# Patient Record
Sex: Female | Born: 1940 | Race: White | Hispanic: No | Marital: Married | Smoking: Never smoker
Health system: Southern US, Community
[De-identification: ages and names within clinical notes are randomized; demographics above are authoritative.]

## PROBLEM LIST (undated history)

## (undated) DIAGNOSIS — M1990 Unspecified osteoarthritis, unspecified site: Secondary | ICD-10-CM

## (undated) DIAGNOSIS — E042 Nontoxic multinodular goiter: Secondary | ICD-10-CM

## (undated) DIAGNOSIS — M4802 Spinal stenosis, cervical region: Secondary | ICD-10-CM

## (undated) DIAGNOSIS — K219 Gastro-esophageal reflux disease without esophagitis: Secondary | ICD-10-CM

## (undated) DIAGNOSIS — M48061 Spinal stenosis, lumbar region without neurogenic claudication: Secondary | ICD-10-CM

## (undated) DIAGNOSIS — T8859XA Other complications of anesthesia, initial encounter: Secondary | ICD-10-CM

## (undated) DIAGNOSIS — T4145XA Adverse effect of unspecified anesthetic, initial encounter: Secondary | ICD-10-CM

## (undated) DIAGNOSIS — E559 Vitamin D deficiency, unspecified: Secondary | ICD-10-CM

## (undated) DIAGNOSIS — N183 Chronic kidney disease, stage 3 unspecified: Secondary | ICD-10-CM

## (undated) DIAGNOSIS — Z789 Other specified health status: Secondary | ICD-10-CM

## (undated) DIAGNOSIS — D329 Benign neoplasm of meninges, unspecified: Secondary | ICD-10-CM

## (undated) DIAGNOSIS — E7800 Pure hypercholesterolemia, unspecified: Secondary | ICD-10-CM

## (undated) DIAGNOSIS — N3281 Overactive bladder: Secondary | ICD-10-CM

## (undated) DIAGNOSIS — I10 Essential (primary) hypertension: Secondary | ICD-10-CM

## (undated) DIAGNOSIS — G4700 Insomnia, unspecified: Secondary | ICD-10-CM

## (undated) DIAGNOSIS — R0602 Shortness of breath: Secondary | ICD-10-CM

## (undated) DIAGNOSIS — K589 Irritable bowel syndrome without diarrhea: Secondary | ICD-10-CM

## (undated) HISTORY — DX: Spinal stenosis, lumbar region without neurogenic claudication: M48.061

## (undated) HISTORY — DX: Other specified health status: Z78.9

## (undated) HISTORY — PX: CARDIAC CATHETERIZATION: SHX172

## (undated) HISTORY — DX: Benign neoplasm of meninges, unspecified: D32.9

## (undated) HISTORY — DX: Nontoxic multinodular goiter: E04.2

## (undated) HISTORY — PX: ABDOMINAL HYSTERECTOMY: SHX81

## (undated) HISTORY — DX: Insomnia, unspecified: G47.00

## (undated) HISTORY — DX: Pure hypercholesterolemia, unspecified: E78.00

## (undated) HISTORY — DX: Spinal stenosis, cervical region: M48.02

## (undated) HISTORY — PX: LUMBAR FUSION: SHX111

## (undated) HISTORY — DX: Overactive bladder: N32.81

## (undated) HISTORY — DX: Chronic kidney disease, stage 3 unspecified: N18.30

## (undated) HISTORY — PX: CERVICAL DISC SURGERY: SHX588

## (undated) HISTORY — DX: Irritable bowel syndrome, unspecified: K58.9

## (undated) HISTORY — PX: CHOLECYSTECTOMY: SHX55

## (undated) HISTORY — DX: Vitamin D deficiency, unspecified: E55.9

## (undated) HISTORY — PX: BACK SURGERY: SHX140

---

## 2008-07-19 ENCOUNTER — Inpatient Hospital Stay (HOSPITAL_COMMUNITY): Admission: RE | Admit: 2008-07-19 | Discharge: 2008-07-22 | Payer: Self-pay | Admitting: Neurosurgery

## 2008-12-10 ENCOUNTER — Encounter: Admission: RE | Admit: 2008-12-10 | Discharge: 2008-12-10 | Payer: Self-pay | Admitting: Neurosurgery

## 2008-12-15 ENCOUNTER — Inpatient Hospital Stay (HOSPITAL_COMMUNITY): Admission: RE | Admit: 2008-12-15 | Discharge: 2008-12-16 | Payer: Self-pay | Admitting: Neurosurgery

## 2009-06-18 ENCOUNTER — Encounter: Admission: RE | Admit: 2009-06-18 | Discharge: 2009-06-18 | Payer: Self-pay | Admitting: Neurosurgery

## 2010-10-22 LAB — DIFFERENTIAL
Eosinophils Relative: 0 % (ref 0–5)
Lymphocytes Relative: 19 % (ref 12–46)
Lymphs Abs: 1.8 10*3/uL (ref 0.7–4.0)
Monocytes Relative: 4 % (ref 3–12)

## 2010-10-22 LAB — PROTIME-INR
INR: 1 (ref 0.00–1.49)
Prothrombin Time: 13.4 seconds (ref 11.6–15.2)

## 2010-10-22 LAB — CBC
HCT: 36.7 % (ref 36.0–46.0)
Hemoglobin: 12.7 g/dL (ref 12.0–15.0)
Platelets: 184 10*3/uL (ref 150–400)
RBC: 3.88 MIL/uL (ref 3.87–5.11)
WBC: 9.8 10*3/uL (ref 4.0–10.5)

## 2010-10-22 LAB — BASIC METABOLIC PANEL
BUN: 15 mg/dL (ref 6–23)
GFR calc Af Amer: >60 mL/min (ref >60)
GFR calc non Af Amer: >60 mL/min (ref >60)
Potassium: 3.7 mEq/L (ref 3.5–5.1)
Sodium: 139 mEq/L (ref 135–145)

## 2010-10-29 LAB — BASIC METABOLIC PANEL
CO2: 32 mEq/L (ref 19–32)
Calcium: 7.4 mg/dL — ABNORMAL LOW (ref 8.4–10.5)
GFR calc Af Amer: >60 mL/min (ref >60)
GFR calc non Af Amer: >60 mL/min (ref >60)
Sodium: 133 mEq/L — ABNORMAL LOW (ref 135–145)

## 2010-10-29 LAB — CBC
Hemoglobin: 10 g/dL — ABNORMAL LOW (ref 12.0–15.0)
RBC: 3.11 MIL/uL — ABNORMAL LOW (ref 3.87–5.11)

## 2010-11-27 NOTE — Op Note (Signed)
[name], [name]                ACCOUNT NO.:  [id number]   MEDICAL RECORD NO.:  [id number]          PATIENT TYPE:  INP   LOCATION:  3018                         FACILITY:  [facility]   PHYSICIAN:  Clydene Fake, M.D.  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   PROCEDURE:  Decompressive laminectomy, decompressing L3-L4, and L5 root  (3 levels), posterior right fusion at L3-4, L4-L5, Saber interbody cages  at L3-4 and L4-5, Expedium segmented pedicle screw fixation L3 through  L5, posterolateral fusion L3 through L5 (2 levels) autograft same  incision infuse BMP.   SURGEON:  Clydene Fake, MD   ASSISTANT:  Danae Orleans. Venetia Maxon, MD.   ANESTHESIA:  General endotracheal tube anesthesia.   BLOOD LOSS:  700 mL.   BLOOD GIVEN:  400 mL.   CELL SAVER:  Returned.   COMPLICATIONS:  None.   DRAINS:  None.   INDICATIONS:  The patient is a 70 year old woman with back and leg pain,  trouble walking.  MRI and x-rays were done showing anterolisthesis at L4-  L5 almost grade 2 severe spondylitic changes at about L3-4, L4-5, severe  stenosis to the spinal change.  Note, the numbering system is based on  the MRI and the MRI report.  X-rays were done it does appear that there  is a lumbar loss at S1and S2 and these levels most likely L4-5 for the  rest this dictation and the name of operation were taken with the  numbering system from the MRI.  Following this L3-4 and L4-5 level of  the sacralized L5-S1   PROCEDURE IN DETAIL:  The patient was brought to operating room and  general anesthesia induced.  The patient was placed on prone position in  a Wilson frame, all pressure points were  padded.  The patient was  prepped and draped in a sterile fashion.  Standard incision injected  with 20 mL of 1% Xylocaine with epinephrine.  Incision was made midline  in the lower lumbar spine incision taken down the fascia.  Hemostasis  was obtained with Bovie cauterization.  Fascia was incised and  subperiosteal dissection was done with the spinous process lamina and  couple of this segments and markers were placed interspace.  X-rays  obtained showing markers were at L4-5 and L5-1 levels.  We then did our  full exposure exposing the spinous process lamina, facets and transverse  processes at L4-5 and S1.  Markers were then placed at the transverse  processes L3-L4 and L5.  Another x-rays was obtained confirming our  positioning at this point.  Decompressive laminectomy was then done  using Leksell rongeur, Kerrison punches, high-speed drill and  decompressing the thecal sac and decompressing the nerve roots of L3-L4  and L5 bilaterally.  An extensive lateral decompression was done over  all these nerve roots above  and beyond was needed for interbody fusion  to make sure we had good decompression of the nerve roots performed  significant medial facetectomy at the levels.  Once we had good  decompression of the thecal sac nerve roots and explored disk spaces we  can see the spinal pieces at L4-5.  Disk space was massaged and  diskectomy was made pituitary rongeurs and curettes.  We distracted the  interspace of 10 mm bilaterally prepared the interspace for interbody  fusion using the scrapers, broaches, and curettes.  All bone that was  removed during laminectomy was cleaned from its soft tissue and chopped  into small pieces.  Two tail Saber cages were then packed with infuse  BMP and autograft bone was packed.  The interspace with autograft bone  tapped the cages into place and the interspace at L4-5.  This process  repeated at L3-4.  Diskectomy was performed distracting the  interspace  with 10 mm prepared the space for interbody fusion using the broaches  and then packed two 10 mm high Saber cages with infused BMP autograft  bone and tapped the cages into place after packing interspace with  autograft bone.  Explored the thecal sac and nerve roots.  We had good  decompression still and the cages were firmly in place.  We used high-  speed drill to decorticate lateral facets and transverse process from L3-  5 bilaterally using fluoroscopy and intraoperative landmarks, pedicle  entry points L3-L4 and L5 were found, decorticated with high-speed  drill.  The probe was placed on the pedicle.  We tapped the pedicle into  small ball probe to make sure good bony circumference then placed an  Expedium pedicle screw.  This was done first on the right side and then  the left side.  We took final AP and lateral fluoroscopic images showing  good position at the interbody cages and pedicle screws.  We packed the  posterior gutters with infused BMP bilaterally.  The rest of the  autograft bone from L3 through L5 and L3 through L5 fusion.  Rods were  placed in the screw heads on both sides locking nuts placed.  These were  final tightened.  We explored the second nerve roots.  We achieved good  hemostasis.  L3-4, L4-5 nerve roots and central canal well decompressed  and placed some Gelfoam over the lateral gutters, no bone fragments that  could impinge on the nerve root.  Retractors removed and paraspinous  muscles closed with 0 Vicryl interrupted sutures.  Fascia closed with 0  Vicryl interrupted suture.  Skin closed with 2-0 and 3-0 Vicryl  interrupted sutures.  Skin closed with benzoin, Steri-Strips.  Dressing  was placed.  The patient was placed back in a supine position.  The  patient was transferred to the recovery in stable condition.           ______________________________  Clydene Fake, M.D.     JRH/MEDQ  D:  07/19/2008  T:  07/20/2008  Job:  [id number]

## 2010-11-27 NOTE — Op Note (Signed)
NAMECAMBRY, SPAMPINATO                ACCOUNT NO.:  [id number]   MEDICAL RECORD NO.:  [id number]          PATIENT TYPE:  INP   LOCATION:  3536                         FACILITY:  [facility]   PHYSICIAN:  Clydene Fake, M.D.  DATE OF BIRTH:  02/08/1941   DATE OF PROCEDURE:  12/15/2008  DATE OF DISCHARGE:                               OPERATIVE REPORT   PREOPERATIVE DIAGNOSES:  Herniated nucleus pulposus, stenosis,  myelopathy, and cord compression of C4-5 and C5-6.   POSTOPERATIVE DIAGNOSES:  Herniated nucleus pulposus, stenosis,  myelopathy, and cord compression of C4-5 and C5-6.   PROCEDURE:  Anterior cervical decompression, diskectomy, and fusion at  C4-5 and C5-6 with LifeNet allograft bone and Trestle anterior cervical  plate.   SURGEON:  Clydene Fake, MD   ASSISTANT:  Payton Doughty, MD.   General endotracheal tube anesthesia.   ESTIMATED BLOOD LOSS:  Minimal.   BLOOD GIVEN:  None.   DRAINS:  None.   COMPLICATIONS:  None.   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  Reflex  shows __________ 3+/4 in the upper and lower extremities with couple  beats of clonus bilaterally, and gait is wide based and certainly in the  stable.  MRI of cervical spine shows disk herniation centrally at C4-5  causing cord compression, and there is a cord change and cord edema at  that level, and there is severe spinal changes at C5-6 causing stenosis  also.  The patient brought in for decompression and fusion.   PROCEDURE IN DETAIL:  The patient was brought to the operating room,  general anesthesia was induced.  The patient was placed in 10-pound  Halter traction, prepped and draped in the sterile fashion.  Site of  incision injected with 10 mL of 1% lidocaine with epinephrine.  Incision  was then made from the midline to the anterior  border of the  sternocleidomastoid muscle on the left side.  The neck incision was  taken down to the platysma and hemostasis was obtained with Bovie  cauterization.  The platysma was incised with a Bovie and blunt  dissection taken through the anterior cervical fascia at the anterior  cervical spine.  Needle was placed in interspace.  X-rays were obtained  showing the needles at the C4-5 interspace.  Disk space was incised with  15 blade and partial diskectomy performed as the needle was removed.  The longus colli muscles were reflected laterally from C4 through C6,  and a self-retaining retractor was placed.  Osteophyte tool was used to  remove osteophytes at C4-5 and C5-6.  Disk spaces were incised and  diskectomy continued at C4-5 and C5-6.  Anterior osteophytes were  removed with high-speed drill and Kerrison punches, and distraction pins  were placed in the C4 and C6 and interspaces distracted.  Microscope was  brought in for microdissection at this point and __________ C4-5 level  where diskectomy continued with the curettes  and pituitary rongeurs and  a large central disk herniation was found compressing into the spinal  cord and this was removed with micro hooks and pituitary rongeurs.  We  then continued to remove the posterior disk, osteophyte, and ligament  with Leksell rongeurs, good decompression of the central canal and  performed bilateral foraminotomies.  When we were finished we had good  decompression, spinal cord and bilateral nerve roots, used high-speed  drill to remove cartilaginous endplates, measured the disk space to be 6  mm.  We got hemostasis with Gelfoam and thrombin.  The C5-6 level was  clearly spondylitic, and we used to drill to drill through the disk  space and cartilaginous endplate down to the posterior ligament, and 1-  and 2-mm Kerrison punches then used to remove posterior disk ligaments  and osteophytes, decompression of the central canal and  performed  bilateral foraminotomies where there appears to be good central lateral  decompression.  We measured height of disk space to be at 4 mm, and we  got hemostasis with Gelfoam and thrombin.  Gelfoam and thrombin was  irrigated out from the 2 disk spaces and we tapped 6-mm bone graft in  place and then the 4-mm bone graft in place.  We checked posterior to  the graft, there was plenty of room between bone graft and dura at each  spots.  Distraction pins were removed.  Hemostasis obtained with Gelfoam  and thrombin.  Weight was removed from the traction.  Bone was firmly in  place, and a Trestle anterior cervical plate was placed over the  anterior cervical spine, and 2 screws were placed in the C4, 2 in the  C5, 2 in the C6.  These were all tightened down.  Lateral x-rays were  obtained showing good position of  plates, screws, and bone plug at C4-5  and C5-6.  Retractors were removed.  Hemostasis was obtained with  Gelfoam and thrombin and we irrigated that out with antibiotic solution.  We had good hemostasis and the platysma was closed with 3-0 Vicryl  interrupted sutures.  Subcutaneous tissue closed with same.  Skin closed  with Benzoin and Steri-Strips.  Dressing was placed.  The patient was  placed in a soft cervical collar, awoken from anesthesia, and  transferred to the recovery room in stable condition.           ______________________________  Clydene Fake, M.D.     JRH/MEDQ  D:  12/15/2008  T:  12/16/2008  Job:  [id number]

## 2010-11-30 NOTE — Discharge Summary (Signed)
NAMECARMEN, [name]                ACCOUNT NO.:  [id number]   MEDICAL RECORD NO.:  [id number]          PATIENT TYPE:  INP   LOCATION:  3018                         FACILITY:  [facility]   PHYSICIAN:  Clydene Fake, M.D.  DATE OF BIRTH:  [birth year]   DATE OF ADMISSION:  07/19/2008  DATE OF DISCHARGE:  07/22/2008                               DISCHARGE SUMMARY   ADMISSION DIAGNOSES:  Unstable spondylolisthesis, stenosis, spondylosis  with radiculopathy, L3-L4 and L4-L5.   DISCHARGE DIAGNOSES:  Unstable spondylolisthesis, stenosis, spondylosis  with radiculopathy, L3-L4 and L4-L5.   PROCEDURES:  Decompressive laminectomy, decompression of the L3, L4, L5  roots, posterior lumbar interbody fusion, L3-L4 and L4-L5 with Saber  interbody cages, Expedium pedicle screw fixation, and posterolateral  fusion with autograft and Infuse.   REASON FOR ADMISSION:  The patient is a 70 year old woman with back and  right leg pain and trouble walking.  MRI and x-rays show retrolisthesis  of L4-L5 and severe stenosis, spinal changes, degenerative disk disease,  disk herniation at L3-L4 and L4-L5 causing root compression and  stenosis.  The patient was admitted for decompression and fusion.   HOSPITAL COURSE:  The patient was admitted on the day of surgery and  underwent the procedure above without complications.  Postop, the  patient was transferred to the recovery room and then to the floor.  The  following day, the incision was clean, dry, and intact with much less  leg pain, similar incision pain.  She was started on PCA and then  started increasing activity and getting up PT, OT consultations to  assist with the patient's mobility.  The following day, she was up and  ambulating, up in chair again with no leg pain or ambulating better.  I  am happy with her progress and we just switched her to p.o. medicines  and advanced diet.  By July 22, 2008, she had bowel movements, she was  up and ambulating  well, less incisional pain, and was discharged to home  in stable condition.   DISCHARGE MEDICATIONS:  Same as prehospitalization plus Percocet,  Flexeril, and 5 days of Cipro.   FOLLOWUP:  Followup with me in 3 weeks in my office up with brace.   ACTIVITY:  No strenuous activity.           ______________________________  Clydene Fake, M.D.     JRH/MEDQ  D:  08/18/2008  T:  08/18/2008  Job:  [id number]

## 2011-04-19 LAB — BASIC METABOLIC PANEL
CO2: 29 mEq/L (ref 19–32)
Glucose, Bld: 87 mg/dL (ref 70–99)
Potassium: 3.3 mEq/L — ABNORMAL LOW (ref 3.5–5.1)
Sodium: 134 mEq/L — ABNORMAL LOW (ref 135–145)

## 2011-04-19 LAB — TYPE AND SCREEN: Antibody Screen: NEGATIVE

## 2011-04-19 LAB — URINALYSIS, ROUTINE W REFLEX MICROSCOPIC
Bilirubin Urine: NEGATIVE
Hgb urine dipstick: NEGATIVE
Nitrite: NEGATIVE
Specific Gravity, Urine: 1.004 — ABNORMAL LOW (ref 1.005–1.030)
pH: 6 (ref 5.0–8.0)

## 2011-04-19 LAB — CBC
HCT: 40.3 % (ref 36.0–46.0)
Hemoglobin: 13.6 g/dL (ref 12.0–15.0)
MCHC: 33.8 g/dL (ref 30.0–36.0)
RDW: 12.9 % (ref 11.5–15.5)

## 2011-10-28 ENCOUNTER — Other Ambulatory Visit: Payer: Self-pay | Admitting: Neurosurgery

## 2011-10-28 DIAGNOSIS — M4712 Other spondylosis with myelopathy, cervical region: Secondary | ICD-10-CM

## 2011-10-28 DIAGNOSIS — M545 Low back pain: Secondary | ICD-10-CM

## 2011-11-04 ENCOUNTER — Ambulatory Visit
Admission: RE | Admit: 2011-11-04 | Discharge: 2011-11-04 | Disposition: A | Discharge status: Home / Self Care | Payer: BC Managed Care – PPO | Source: Ambulatory Visit | Attending: Neurosurgery | Admitting: Neurosurgery

## 2011-11-04 DIAGNOSIS — M545 Low back pain: Secondary | ICD-10-CM

## 2011-11-04 DIAGNOSIS — M4712 Other spondylosis with myelopathy, cervical region: Secondary | ICD-10-CM

## 2011-11-04 MED ORDER — GADOBENATE DIMEGLUMINE 529 MG/ML IV SOLN
14.0000 mL | Freq: Once | INTRAVENOUS | Status: AC | PRN
  Administered 2011-11-04: 14 mL via INTRAVENOUS

## 2011-11-12 ENCOUNTER — Other Ambulatory Visit: Payer: Self-pay | Admitting: Neurosurgery

## 2011-12-02 ENCOUNTER — Encounter (HOSPITAL_COMMUNITY): Payer: Self-pay | Admitting: Pharmacy Technician

## 2011-12-03 ENCOUNTER — Inpatient Hospital Stay (HOSPITAL_COMMUNITY): Admission: RE | Admit: 2011-12-03 | Payer: BC Managed Care – PPO | Source: Ambulatory Visit

## 2011-12-04 ENCOUNTER — Inpatient Hospital Stay (HOSPITAL_COMMUNITY): Admission: RE | Admit: 2011-12-04 | Discharge: 2011-12-04 | Payer: Medicare Other | Source: Ambulatory Visit

## 2011-12-04 ENCOUNTER — Encounter (HOSPITAL_COMMUNITY): Payer: Self-pay

## 2011-12-04 HISTORY — DX: Gastro-esophageal reflux disease without esophagitis: K21.9

## 2011-12-04 HISTORY — DX: Unspecified osteoarthritis, unspecified site: M19.90

## 2011-12-04 HISTORY — DX: Essential (primary) hypertension: I10

## 2011-12-04 HISTORY — DX: Shortness of breath: R06.02

## 2011-12-04 NOTE — Progress Notes (Signed)
REQUESTED STRESS TEST FROM [facility]. 

## 2011-12-04 NOTE — Progress Notes (Signed)
VERIFIED LAB APPOINTMENT WITH PATIENT 12/05/11  1000AM.

## 2011-12-04 NOTE — Pre-Procedure Instructions (Signed)
20 [name]  12/04/2011   Your procedure is scheduled on: Monday  12/16/11  Report to [facility] at 530 AM.  Call this number if you have problems the morning of surgery: [phone]   Remember:   Do not eat food:After Midnight.  May have clear liquids: up to 4 Hours before arrival.(UNTIL 130 AM)  Clear liquids include soda, tea, black coffee, apple or grape juice, broth.  Take these medicines the morning of surgery with A SIP OF WATER: ALBUTEROL, TOPROL(METOPROLOL), PRILOSEC   (STOP ASPIRIN, PLAVIX, COUMADIN ,EFFIENT, HERBAL MEDICINES)   Do not wear jewelry, make-up or nail polish.  Do not wear lotions, powders, or perfumes. You may wear deodorant.  Do not shave 48 hours prior to surgery. Men may shave face and neck.  Do not bring valuables to the hospital.  Contacts, dentures or bridgework may not be worn into surgery.  Leave suitcase in the car. After surgery it may be brought to your room.  For patients admitted to the hospital, checkout time is 11:00 AM the day of discharge.   Patients discharged the day of surgery will not be allowed to drive home.  Name and phone number of your driver:   Special Instructions: CHG Shower Use Special Wash: 1/2 bottle night before surgery and 1/2 bottle morning of surgery.   Please read over the following fact sheets that you were given: Pain Booklet, MRSA Information and Surgical Site Infection Prevention

## 2011-12-05 ENCOUNTER — Encounter (HOSPITAL_COMMUNITY)
Admission: RE | Admit: 2011-12-05 | Discharge: 2011-12-05 | Disposition: A | Discharge status: Home / Self Care | Payer: Medicare Other | Source: Ambulatory Visit | Attending: Neurosurgery | Admitting: Neurosurgery

## 2011-12-05 LAB — CBC
MCHC: 35.7 g/dL (ref 30.0–36.0)
Platelets: 223 10*3/uL (ref 150–400)
RDW: 12.7 % (ref 11.5–15.5)

## 2011-12-05 LAB — BASIC METABOLIC PANEL
BUN: 14 mg/dL (ref 6–23)
Calcium: 9.7 mg/dL (ref 8.4–10.5)
Creatinine, Ser: 0.68 mg/dL (ref 0.50–1.10)
GFR calc Af Amer: >90 mL/min (ref >90)
GFR calc non Af Amer: 87 mL/min — ABNORMAL LOW (ref >90)
Potassium: 3.5 mEq/L (ref 3.5–5.1)

## 2011-12-05 LAB — TYPE AND SCREEN
ABO/RH(D): B NEG
Antibody Screen: NEGATIVE

## 2011-12-12 ENCOUNTER — Encounter (HOSPITAL_COMMUNITY): Payer: Self-pay | Admitting: *Deleted

## 2011-12-12 ENCOUNTER — Encounter (HOSPITAL_COMMUNITY): Payer: Self-pay | Admitting: General Practice

## 2011-12-12 ENCOUNTER — Encounter (HOSPITAL_COMMUNITY): Payer: Self-pay | Admitting: Anesthesiology

## 2011-12-12 ENCOUNTER — Ambulatory Visit (HOSPITAL_COMMUNITY): Payer: Medicare Other | Admitting: Anesthesiology

## 2011-12-12 ENCOUNTER — Inpatient Hospital Stay (HOSPITAL_COMMUNITY)
Admission: RE | Admit: 2011-12-12 | Discharge: 2011-12-17 | DRG: 460 | Disposition: A | Discharge status: Home / Self Care | Payer: Medicare Other | Source: Ambulatory Visit | Attending: Neurosurgery | Admitting: Neurosurgery

## 2011-12-12 ENCOUNTER — Ambulatory Visit (HOSPITAL_COMMUNITY): Payer: Medicare Other

## 2011-12-12 ENCOUNTER — Encounter (HOSPITAL_COMMUNITY)
Admission: RE | Disposition: A | Discharge status: Home / Self Care | Payer: Self-pay | Source: Ambulatory Visit | Attending: Neurosurgery

## 2011-12-12 DIAGNOSIS — Y9210 Unspecified residential institution as the place of occurrence of the external cause: Secondary | ICD-10-CM | POA: Diagnosis not present

## 2011-12-12 DIAGNOSIS — G9741 Accidental puncture or laceration of dura during a procedure: Secondary | ICD-10-CM | POA: Diagnosis not present

## 2011-12-12 DIAGNOSIS — K219 Gastro-esophageal reflux disease without esophagitis: Secondary | ICD-10-CM | POA: Diagnosis present

## 2011-12-12 DIAGNOSIS — M4716 Other spondylosis with myelopathy, lumbar region: Principal | ICD-10-CM | POA: Diagnosis present

## 2011-12-12 DIAGNOSIS — J45909 Unspecified asthma, uncomplicated: Secondary | ICD-10-CM | POA: Diagnosis present

## 2011-12-12 DIAGNOSIS — IMO0002 Reserved for concepts with insufficient information to code with codable children: Secondary | ICD-10-CM | POA: Diagnosis not present

## 2011-12-12 DIAGNOSIS — M4310 Spondylolisthesis, site unspecified: Secondary | ICD-10-CM | POA: Diagnosis present

## 2011-12-12 DIAGNOSIS — M129 Arthropathy, unspecified: Secondary | ICD-10-CM | POA: Diagnosis present

## 2011-12-12 DIAGNOSIS — I10 Essential (primary) hypertension: Secondary | ICD-10-CM | POA: Diagnosis present

## 2011-12-12 DIAGNOSIS — Z01812 Encounter for preprocedural laboratory examination: Secondary | ICD-10-CM

## 2011-12-12 HISTORY — DX: Adverse effect of unspecified anesthetic, initial encounter: T41.45XA

## 2011-12-12 HISTORY — DX: Other complications of anesthesia, initial encounter: T88.59XA

## 2011-12-12 LAB — URINALYSIS, ROUTINE W REFLEX MICROSCOPIC
Glucose, UA: NEGATIVE mg/dL
Protein, ur: NEGATIVE mg/dL
pH: 5.5 (ref 5.0–8.0)

## 2011-12-12 LAB — URINE MICROSCOPIC-ADD ON

## 2011-12-12 LAB — DIFFERENTIAL
Lymphocytes Relative: 56 % — ABNORMAL HIGH (ref 12–46)
Lymphs Abs: 3.4 10*3/uL (ref 0.7–4.0)
Monocytes Absolute: 0.4 10*3/uL (ref 0.1–1.0)
Monocytes Relative: 6 % (ref 3–12)
Neutro Abs: 2.2 10*3/uL (ref 1.7–7.7)

## 2011-12-12 SURGERY — POSTERIOR LUMBAR FUSION 1 LEVEL
Anesthesia: General | Site: Back | Wound class: Clean

## 2011-12-12 MED ORDER — DIPHENHYDRAMINE HCL 12.5 MG/5ML PO ELIX: 12.5000 mg | ORAL_SOLUTION | Freq: Four times a day (QID) | ORAL | Status: DC | PRN

## 2011-12-12 MED ORDER — MORPHINE SULFATE (PF) 1 MG/ML IV SOLN
INTRAVENOUS | Status: AC
  Filled 2011-12-12: qty 25

## 2011-12-12 MED ORDER — NEOSTIGMINE METHYLSULFATE 1 MG/ML IJ SOLN
INTRAMUSCULAR | Status: DC | PRN
  Administered 2011-12-12: 4 mg via INTRAVENOUS

## 2011-12-12 MED ORDER — METHOCARBAMOL 100 MG/ML IJ SOLN
500.0000 mg | Freq: Four times a day (QID) | INTRAVENOUS | Status: DC | PRN
  Administered 2011-12-12: 500 mg via INTRAVENOUS
  Filled 2011-12-12: qty 5

## 2011-12-12 MED ORDER — DICYCLOMINE HCL 10 MG PO CAPS
10.0000 mg | ORAL_CAPSULE | Freq: Four times a day (QID) | ORAL | Status: DC | PRN
  Filled 2011-12-12: qty 1

## 2011-12-12 MED ORDER — DEXAMETHASONE SODIUM PHOSPHATE 4 MG/ML IJ SOLN: 4.0000 mg | Freq: Once | INTRAMUSCULAR | Status: DC

## 2011-12-12 MED ORDER — HYDROMORPHONE HCL PF 1 MG/ML IJ SOLN
INTRAMUSCULAR | Status: AC
  Filled 2011-12-12: qty 1

## 2011-12-12 MED ORDER — SODIUM CHLORIDE 0.9 % IR SOLN
Status: DC | PRN
  Administered 2011-12-12: 08:00:00

## 2011-12-12 MED ORDER — ALBUTEROL SULFATE HFA 108 (90 BASE) MCG/ACT IN AERS
2.0000 | INHALATION_SPRAY | Freq: Four times a day (QID) | RESPIRATORY_TRACT | Status: DC | PRN
  Filled 2011-12-12: qty 6.7

## 2011-12-12 MED ORDER — PROMETHAZINE HCL 25 MG PO TABS: 12.5000 mg | ORAL_TABLET | ORAL | Status: DC | PRN

## 2011-12-12 MED ORDER — CEFAZOLIN SODIUM 1-5 GM-% IV SOLN
INTRAVENOUS | Status: AC
  Filled 2011-12-12: qty 50

## 2011-12-12 MED ORDER — SODIUM CHLORIDE 0.9 % IV SOLN: 250.0000 mL | INTRAVENOUS | Status: DC

## 2011-12-12 MED ORDER — THROMBIN 20000 UNITS EX KIT
PACK | CUTANEOUS | Status: DC | PRN
  Administered 2011-12-12: 08:00:00 via TOPICAL

## 2011-12-12 MED ORDER — GLYCOPYRROLATE 0.2 MG/ML IJ SOLN
INTRAMUSCULAR | Status: DC | PRN
  Administered 2011-12-12: 0.6 mg via INTRAVENOUS

## 2011-12-12 MED ORDER — MIDAZOLAM HCL 5 MG/5ML IJ SOLN
INTRAMUSCULAR | Status: DC | PRN
  Administered 2011-12-12 (×2): 1 mg via INTRAVENOUS

## 2011-12-12 MED ORDER — METOPROLOL SUCCINATE ER 25 MG PO TB24
25.0000 mg | ORAL_TABLET | Freq: Every day | ORAL | Status: DC
  Administered 2011-12-14 – 2011-12-17 (×3): 25 mg via ORAL
  Filled 2011-12-12 (×5): qty 1

## 2011-12-12 MED ORDER — DOCUSATE SODIUM 100 MG PO CAPS
100.0000 mg | ORAL_CAPSULE | Freq: Two times a day (BID) | ORAL | Status: DC
  Administered 2011-12-12 – 2011-12-17 (×10): 100 mg via ORAL
  Filled 2011-12-12 (×9): qty 1

## 2011-12-12 MED ORDER — PANTOPRAZOLE SODIUM 40 MG PO TBEC
40.0000 mg | DELAYED_RELEASE_TABLET | Freq: Every day | ORAL | Status: DC
  Administered 2011-12-13 – 2011-12-16 (×4): 40 mg via ORAL
  Filled 2011-12-12 (×4): qty 1

## 2011-12-12 MED ORDER — HYDROMORPHONE HCL PF 1 MG/ML IJ SOLN
0.2500 mg | INTRAMUSCULAR | Status: DC | PRN
  Administered 2011-12-12 (×2): 0.5 mg via INTRAVENOUS

## 2011-12-12 MED ORDER — LIDOCAINE HCL (CARDIAC) 20 MG/ML IV SOLN
INTRAVENOUS | Status: DC | PRN
  Administered 2011-12-12: 40 mg via INTRAVENOUS

## 2011-12-12 MED ORDER — NALOXONE HCL 0.4 MG/ML IJ SOLN: 0.4000 mg | INTRAMUSCULAR | Status: DC | PRN

## 2011-12-12 MED ORDER — DEXAMETHASONE SODIUM PHOSPHATE 4 MG/ML IJ SOLN
INTRAMUSCULAR | Status: AC
  Administered 2011-12-12: 4 mg
  Filled 2011-12-12: qty 1

## 2011-12-12 MED ORDER — ONDANSETRON HCL 4 MG/2ML IJ SOLN: 4.0000 mg | Freq: Four times a day (QID) | INTRAMUSCULAR | Status: DC | PRN

## 2011-12-12 MED ORDER — PROMETHAZINE HCL 25 MG/ML IJ SOLN: 6.2500 mg | INTRAMUSCULAR | Status: DC | PRN

## 2011-12-12 MED ORDER — SODIUM CHLORIDE 0.9 % IV SOLN
INTRAVENOUS | Status: AC
  Filled 2011-12-12: qty 500

## 2011-12-12 MED ORDER — DIPHENHYDRAMINE HCL 50 MG/ML IJ SOLN
12.5000 mg | Freq: Four times a day (QID) | INTRAMUSCULAR | Status: DC | PRN
  Administered 2011-12-13: 12.5 mg via INTRAVENOUS
  Filled 2011-12-12: qty 1

## 2011-12-12 MED ORDER — SODIUM CHLORIDE 0.9 % IJ SOLN: 9.0000 mL | INTRAMUSCULAR | Status: DC | PRN

## 2011-12-12 MED ORDER — ACETAMINOPHEN 650 MG RE SUPP: 650.0000 mg | RECTAL | Status: DC | PRN

## 2011-12-12 MED ORDER — CYCLOBENZAPRINE HCL 10 MG PO TABS
10.0000 mg | ORAL_TABLET | Freq: Three times a day (TID) | ORAL | Status: DC | PRN
  Administered 2011-12-13 – 2011-12-16 (×5): 10 mg via ORAL
  Filled 2011-12-12 (×5): qty 1

## 2011-12-12 MED ORDER — KETOROLAC TROMETHAMINE 30 MG/ML IJ SOLN
INTRAMUSCULAR | Status: AC
  Filled 2011-12-12: qty 1

## 2011-12-12 MED ORDER — SODIUM CHLORIDE 0.9 % IV SOLN
INTRAVENOUS | Status: DC | PRN
  Administered 2011-12-12: 12:00:00 via INTRAVENOUS

## 2011-12-12 MED ORDER — KCL IN DEXTROSE-NACL 20-5-0.45 MEQ/L-%-% IV SOLN
INTRAVENOUS | Status: AC
  Administered 2011-12-12: 1000 mL via INTRAVENOUS
  Filled 2011-12-12: qty 1000

## 2011-12-12 MED ORDER — ONDANSETRON HCL 4 MG/2ML IJ SOLN
INTRAMUSCULAR | Status: DC | PRN
  Administered 2011-12-12: 4 mg via INTRAVENOUS

## 2011-12-12 MED ORDER — SODIUM CHLORIDE 0.9 % IJ SOLN: 3.0000 mL | INTRAMUSCULAR | Status: DC | PRN

## 2011-12-12 MED ORDER — ROCURONIUM BROMIDE 100 MG/10ML IV SOLN
INTRAVENOUS | Status: DC | PRN
  Administered 2011-12-12: 10 mg via INTRAVENOUS
  Administered 2011-12-12: 50 mg via INTRAVENOUS
  Administered 2011-12-12 (×4): 10 mg via INTRAVENOUS

## 2011-12-12 MED ORDER — ALBUTEROL SULFATE (5 MG/ML) 0.5% IN NEBU
INHALATION_SOLUTION | RESPIRATORY_TRACT | Status: AC
  Filled 2011-12-12: qty 0.5

## 2011-12-12 MED ORDER — METHOCARBAMOL 500 MG PO TABS
500.0000 mg | ORAL_TABLET | Freq: Four times a day (QID) | ORAL | Status: DC | PRN
  Administered 2011-12-13: 500 mg via ORAL
  Filled 2011-12-12: qty 1

## 2011-12-12 MED ORDER — LIDOCAINE-EPINEPHRINE 1 %-1:100000 IJ SOLN
INTRAMUSCULAR | Status: DC | PRN
  Administered 2011-12-12: 20 mL

## 2011-12-12 MED ORDER — ZOLPIDEM TARTRATE 5 MG PO TABS: 5.0000 mg | ORAL_TABLET | Freq: Every evening | ORAL | Status: DC | PRN

## 2011-12-12 MED ORDER — KCL IN DEXTROSE-NACL 20-5-0.45 MEQ/L-%-% IV SOLN
INTRAVENOUS | Status: DC
  Administered 2011-12-12: 1000 mL via INTRAVENOUS
  Administered 2011-12-13 – 2011-12-14 (×2): via INTRAVENOUS
  Filled 2011-12-12 (×10): qty 1000

## 2011-12-12 MED ORDER — LACTATED RINGERS IV SOLN
INTRAVENOUS | Status: DC | PRN
  Administered 2011-12-12 (×3): via INTRAVENOUS

## 2011-12-12 MED ORDER — BACITRACIN 50000 UNITS IM SOLR
INTRAMUSCULAR | Status: AC
  Filled 2011-12-12: qty 1

## 2011-12-12 MED ORDER — MIDAZOLAM HCL 2 MG/2ML IJ SOLN: 0.5000 mg | Freq: Once | INTRAMUSCULAR | Status: DC | PRN

## 2011-12-12 MED ORDER — FLEET ENEMA 7-19 GM/118ML RE ENEM: 1.0000 | ENEMA | Freq: Once | RECTAL | Status: AC | PRN

## 2011-12-12 MED ORDER — ONDANSETRON HCL 4 MG/2ML IJ SOLN: 4.0000 mg | INTRAMUSCULAR | Status: DC | PRN

## 2011-12-12 MED ORDER — BISACODYL 10 MG RE SUPP: 10.0000 mg | Freq: Every day | RECTAL | Status: DC | PRN

## 2011-12-12 MED ORDER — SODIUM CHLORIDE 0.9 % IJ SOLN
3.0000 mL | Freq: Two times a day (BID) | INTRAMUSCULAR | Status: DC
  Administered 2011-12-12 – 2011-12-16 (×7): 3 mL via INTRAVENOUS

## 2011-12-12 MED ORDER — ASPIRIN EC 325 MG PO TBEC
325.0000 mg | DELAYED_RELEASE_TABLET | Freq: Every day | ORAL | Status: DC
  Administered 2011-12-14 – 2011-12-17 (×4): 325 mg via ORAL
  Filled 2011-12-12 (×7): qty 1

## 2011-12-12 MED ORDER — MORPHINE SULFATE (PF) 1 MG/ML IV SOLN
INTRAVENOUS | Status: DC
  Administered 2011-12-12: 15:00:00 via INTRAVENOUS
  Administered 2011-12-12: 7 mg via INTRAVENOUS
  Administered 2011-12-13: 1 mg via INTRAVENOUS

## 2011-12-12 MED ORDER — HYDROCHLOROTHIAZIDE 25 MG PO TABS
25.0000 mg | ORAL_TABLET | Freq: Every day | ORAL | Status: DC
  Administered 2011-12-14 – 2011-12-17 (×3): 25 mg via ORAL
  Filled 2011-12-12 (×5): qty 1

## 2011-12-12 MED ORDER — KETOROLAC TROMETHAMINE 30 MG/ML IJ SOLN
30.0000 mg | Freq: Four times a day (QID) | INTRAMUSCULAR | Status: AC
  Administered 2011-12-12 – 2011-12-13 (×4): 30 mg via INTRAVENOUS
  Filled 2011-12-12 (×3): qty 1

## 2011-12-12 MED ORDER — CEFAZOLIN SODIUM 1-5 GM-% IV SOLN
1.0000 g | Freq: Once | INTRAVENOUS | Status: AC
  Administered 2011-12-12: 1 g via INTRAVENOUS

## 2011-12-12 MED ORDER — FENTANYL CITRATE 0.05 MG/ML IJ SOLN
INTRAMUSCULAR | Status: DC | PRN
  Administered 2011-12-12: 150 ug via INTRAVENOUS
  Administered 2011-12-12 (×4): 50 ug via INTRAVENOUS

## 2011-12-12 MED ORDER — PROMETHAZINE HCL 25 MG/ML IJ SOLN
12.5000 mg | INTRAMUSCULAR | Status: DC | PRN
  Filled 2011-12-12: qty 1

## 2011-12-12 MED ORDER — ACETAMINOPHEN 325 MG PO TABS: 650.0000 mg | ORAL_TABLET | ORAL | Status: DC | PRN

## 2011-12-12 MED ORDER — DEXAMETHASONE SODIUM PHOSPHATE 4 MG/ML IJ SOLN
INTRAMUSCULAR | Status: DC | PRN
  Administered 2011-12-12: 4 mg via INTRAVENOUS

## 2011-12-12 MED ORDER — ALBUTEROL SULFATE (5 MG/ML) 0.5% IN NEBU
2.5000 mg | INHALATION_SOLUTION | RESPIRATORY_TRACT | Status: AC
  Administered 2011-12-12: 2.5 mg via RESPIRATORY_TRACT

## 2011-12-12 MED ORDER — 0.9 % SODIUM CHLORIDE (POUR BTL) OPTIME
TOPICAL | Status: DC | PRN
  Administered 2011-12-12: 1000 mL

## 2011-12-12 MED ORDER — PROPOFOL 10 MG/ML IV EMUL
INTRAVENOUS | Status: DC | PRN
  Administered 2011-12-12: 100 mg via INTRAVENOUS

## 2011-12-12 MED ORDER — MAGNESIUM HYDROXIDE 400 MG/5ML PO SUSP: 30.0000 mL | Freq: Every day | ORAL | Status: DC | PRN

## 2011-12-12 MED ORDER — CEFAZOLIN SODIUM 1-5 GM-% IV SOLN
1.0000 g | Freq: Three times a day (TID) | INTRAVENOUS | Status: AC
  Administered 2011-12-12 – 2011-12-13 (×3): 1 g via INTRAVENOUS
  Filled 2011-12-12 (×3): qty 50

## 2011-12-12 MED ORDER — MEPERIDINE HCL 25 MG/ML IJ SOLN: 6.2500 mg | INTRAMUSCULAR | Status: DC | PRN

## 2011-12-12 SURGICAL SUPPLY — 72 items
BAG DECANTER FOR FLEXI CONT (MISCELLANEOUS) ×2 IMPLANT
BENZOIN TINCTURE PRP APPL 2/3 (GAUZE/BANDAGES/DRESSINGS) ×2 IMPLANT
BLADE SURG ROTATE 9660 (MISCELLANEOUS) IMPLANT
BUR PRECISION FLUTE 5.0 (BURR) ×2 IMPLANT
CAGE SABER PLIF 9X9X21MM (Cage) ×4 IMPLANT
CANISTER SUCTION 2500CC (MISCELLANEOUS) ×2 IMPLANT
CLOTH BEACON ORANGE TIMEOUT ST (SAFETY) ×2 IMPLANT
CONT SPEC 4OZ CLIKSEAL STRL BL (MISCELLANEOUS) ×4 IMPLANT
COVER BACK TABLE 24X17X13 BIG (DRAPES) IMPLANT
COVER TABLE BACK 60X90 (DRAPES) ×2 IMPLANT
DECANTER SPIKE VIAL GLASS SM (MISCELLANEOUS) ×2 IMPLANT
DERMABOND ADVANCED (GAUZE/BANDAGES/DRESSINGS)
DERMABOND ADVANCED .7 DNX12 (GAUZE/BANDAGES/DRESSINGS) IMPLANT
DRAPE C-ARM 42X72 X-RAY (DRAPES) ×4 IMPLANT
DRAPE LAPAROTOMY 100X72X124 (DRAPES) ×2 IMPLANT
DRAPE MICROSCOPE LEICA (MISCELLANEOUS) ×2 IMPLANT
DRAPE POUCH INSTRU U-SHP 10X18 (DRAPES) ×2 IMPLANT
DRAPE PROXIMA HALF (DRAPES) IMPLANT
DRESSING TELFA 8X3 (GAUZE/BANDAGES/DRESSINGS) ×2 IMPLANT
DRSG OPSITE 4X5.5 SM (GAUZE/BANDAGES/DRESSINGS) ×2 IMPLANT
DURAPREP 26ML APPLICATOR (WOUND CARE) ×2 IMPLANT
DURASEAL SPINE SEALANT 3ML (MISCELLANEOUS) ×2 IMPLANT
ELECT REM PT RETURN 9FT ADLT (ELECTROSURGICAL) ×2
ELECTRODE REM PT RTRN 9FT ADLT (ELECTROSURGICAL) ×1 IMPLANT
GAUZE SPONGE 4X4 16PLY XRAY LF (GAUZE/BANDAGES/DRESSINGS) IMPLANT
GLOVE BIOGEL PI IND STRL 7.0 (GLOVE) ×2 IMPLANT
GLOVE BIOGEL PI IND STRL 8 (GLOVE) ×1 IMPLANT
GLOVE BIOGEL PI INDICATOR 7.0 (GLOVE) ×2
GLOVE BIOGEL PI INDICATOR 8 (GLOVE) ×1
GLOVE ECLIPSE 7.5 STRL STRAW (GLOVE) ×6 IMPLANT
GLOVE EXAM NITRILE LRG STRL (GLOVE) IMPLANT
GLOVE EXAM NITRILE MD LF STRL (GLOVE) ×2 IMPLANT
GLOVE EXAM NITRILE XL STR (GLOVE) IMPLANT
GLOVE EXAM NITRILE XS STR PU (GLOVE) IMPLANT
GLOVE SURG SS PI 6.5 STRL IVOR (GLOVE) ×8 IMPLANT
GOWN BRE IMP SLV AUR LG STRL (GOWN DISPOSABLE) IMPLANT
GOWN BRE IMP SLV AUR XL STRL (GOWN DISPOSABLE) IMPLANT
GOWN STRL REIN 2XL LVL4 (GOWN DISPOSABLE) IMPLANT
HEALOS II DOUBLE STRIP 10CC (Peek) ×2 IMPLANT
KIT BASIN OR (CUSTOM PROCEDURE TRAY) ×2 IMPLANT
KIT ROOM TURNOVER OR (KITS) ×2 IMPLANT
MILL MEDIUM DISP (BLADE) ×2 IMPLANT
NEEDLE HYPO 22GX1.5 SAFETY (NEEDLE) ×2 IMPLANT
NS IRRIG 1000ML POUR BTL (IV SOLUTION) ×2 IMPLANT
PACK LAMINECTOMY NEURO (CUSTOM PROCEDURE TRAY) ×2 IMPLANT
PAD ARMBOARD 7.5X6 YLW CONV (MISCELLANEOUS) ×6 IMPLANT
PATTIES SURGICAL .5 X.5 (GAUZE/BANDAGES/DRESSINGS) ×2 IMPLANT
PATTIES SURGICAL .75X.75 (GAUZE/BANDAGES/DRESSINGS) ×2 IMPLANT
PUTTY BONE DBX 5CC MIX (Putty) ×2 IMPLANT
ROD EXEDIUM PREBENT 5.5 40MM (Rod) ×1 IMPLANT
ROD EXEDIUM PREBENT 5.5X40 (Rod) ×1 IMPLANT
ROD EXPEDIUM PREBENT 5.5X35MM (Rod) ×2 IMPLANT
RUBBERBAND STERILE (MISCELLANEOUS) ×4 IMPLANT
SCREW EXPEDIUM POLYAXIAL 6X50M (Screw) ×4 IMPLANT
SCREW SET SINGLE INNER (Screw) ×8 IMPLANT
SPONGE GAUZE 4X4 12PLY (GAUZE/BANDAGES/DRESSINGS) ×2 IMPLANT
SPONGE LAP 4X18 X RAY DECT (DISPOSABLE) IMPLANT
SPONGE SURGIFOAM ABS GEL 100 (HEMOSTASIS) ×2 IMPLANT
STRIP CLOSURE SKIN 1/2X4 (GAUZE/BANDAGES/DRESSINGS) ×2 IMPLANT
SUT PROLENE 6 0 BV (SUTURE) ×4 IMPLANT
SUT VIC AB 0 CT1 18XCR BRD8 (SUTURE) ×2 IMPLANT
SUT VIC AB 0 CT1 8-18 (SUTURE) ×2
SUT VIC AB 2-0 CP2 18 (SUTURE) ×4 IMPLANT
SUT VIC AB 3-0 SH 8-18 (SUTURE) ×4 IMPLANT
SYR 20ML ECCENTRIC (SYRINGE) ×2 IMPLANT
SYR CONTROL 10ML LL (SYRINGE) ×2 IMPLANT
TAPE CLOTH SURG 4X10 WHT LF (GAUZE/BANDAGES/DRESSINGS) ×2 IMPLANT
TOWEL OR 17X24 6PK STRL BLUE (TOWEL DISPOSABLE) ×2 IMPLANT
TOWEL OR 17X26 10 PK STRL BLUE (TOWEL DISPOSABLE) ×2 IMPLANT
TRAP SPECIMEN MUCOUS 40CC (MISCELLANEOUS) IMPLANT
TRAY FOLEY CATH 14FRSI W/METER (CATHETERS) ×2 IMPLANT
WATER STERILE IRR 1000ML POUR (IV SOLUTION) ×2 IMPLANT

## 2011-12-12 NOTE — Interval H&P Note (Signed)
History and Physical Interval Note:  12/12/2011 7:55 AM  [name]  has presented today for surgery, with the diagnosis of Spondylolisthesis, Lumbar stenosis, Lumbar spondylosis with myelopathy  The various methods of treatment have been discussed with the patient and family. After consideration of risks, benefits and other options for treatment, the patient has consented to  Procedure(s) (LRB): POSTERIOR LUMBAR FUSION 1 LEVEL (N/A) as a surgical intervention .  The patients' history has been reviewed, patient examined, no change in status, stable for surgery.  I have reviewed the patients' chart and labs.  Questions were answered to the patient's satisfaction.     [name] R

## 2011-12-12 NOTE — Op Note (Signed)
12/12/2011  12:35 PM  PATIENT:  [name]  71 y.o. female  PRE-OPERATIVE DIAGNOSIS: L2-3 Spondylolisthesis, Lumbar stenosis, Lumbar spondylosis with myelopathy, prior surgery  POST-OPERATIVE DIAGNOSIS: same PROCEDURE:  Procedure(s):redo lumbar lam decompressing L2 and L3 roots (2levels) ,  PLIF L2-3, interbody cages L2-3, nonsegmented pedicle screw fixation L2-3 (expedium), postlateral fusion L2-3. Autograft, allograft, bonemarrow aspirate POSTERIOR LUMBAR FUSION 1 LEVEL  SURGEON:  Surgeon(s): Clydene Fake, MD Hewitt Shorts, MD-ASSISTANT    ANESTHESIA:   general  EBL:  Total I/O In: 3030 [I.V.:2900; Blood:130] Out: 700 [Urine:225; Blood:475]  BLOOD ADMINISTERED:130cc  cell sver  DRAINS: none   SPECIMEN:  No Specimen  DICTATION: Patient having back pain leg pain numbness tingling trouble walking workup included an MRI lumbar spine which showed had x-rays which are rectal pieces it to 3 severe stenosis was at level above prior decompression and fusion patient brought in for the redo decompressive and extension of her fusion.  Patient brought in the operating room general anesthesia induced patient placed in prone position all pressure points padded. Patient prepped draped sterile fashion site of incision injected with 20 cc 1% lidocaine with epinephrine. Incision made site of previous incision lower lumbar spine next incision extended cephalad incision to the fascia hemostasis obtained with Bovie position fascia incised and subperiosteal dissection done over the 2 and 3 spinous process lamina out to the facets were then found the top pedicle screw that was 3 when he can reconsider exposure caudally to 6 over the screws and rods from L3-5. We dissect out the largest transverse processes of 2. We then removed the locking nuts and rods were in place and remove the screws at L5 and L4. L3 screws were in good firm position these were left for use later in the case. Decompressive  laminectomy was then done decompression the central canal at L2 and there was left of 3 and into the scar tissue that was there is extreme stenosis facet hypertrophy and some removed with high-speed drill Leksell rongeurs and Kerrison punches. We were finished we could decompress the central canal and good decompression of the 2 and 3 roots bilaterally there was a dural rent that occurred in this for decompression and this was closed primarily with a 6-0 Prolene suture. Explored the disc space and to to 3 incised the disc and in the partial discectomy and then prepared the interspace for interbody fusion by distracted interspace up to 9 mm bilaterally using the various broaches scrapers to per the interbody space for interbody fusion appeared intact to base with autograft bone all the bone component removed from the laminectomy was tapped and a small pieces we also save the bone from the drilling and S2 are mixed together needed this was packed into to a saber interbody cages. Also put in interspace then we tapped the 2 cages into position on each side using fluoroscopy and intraoperative landmarks the ventricle at the L2 pedicles we is high-speed drill to to decorticate placed a pedicle probe down the pedicle into the vertebral body and using fluoroscopy propofol pole with a small ball probe aspirated bone marrow to put on the heel is a prescription and tapped the hole and placed Expedium pedicle screw is into the L2 area and each side. We then placed the rods into the screw heads and each side from L2-3 placed locking nuts and tightened final tightened locking nuts with some compression over the construct. We packed autograft bone in the cisterns with bone  marrow aspirate and DBX putty in the posterior interspaces for posterior fusion L2-3. Again we explored the nerve roots and and thecal sac we did decompression of the central canal and the 2 and 3 roots. No further leakage from the carotid which was sutured with  Prolene dressing was placed over the area and hemostasis retractors removed fascia closed with 0 Vicryl interrupted sutures subcutaneous tissue closed with 02 over 0 Vicryl interrupted sutures skin closed benzoin Steri-Strips dressing was placed patient placed in supine position woken from anesthesia and transferred to recovery room  PLAN OF CARE: Admit to inpatient   PATIENT DISPOSITION:  PACU - hemodynamically stable.

## 2011-12-12 NOTE — H&P (Signed)
Subjective: Pt witth LBP, leg pain, trouble walking - MRI lumbar shows retrolisthesis  and stenosis L2-3 with decompression& fusion L3-5.    There are no active problems to display for this patient.  Past Medical History  Diagnosis Date  . Hypertension     DR [name]  [place] [facility]ARD IN [place]  . Asthma   . Shortness of breath     EXERTION  . GERD (gastroesophageal reflux disease)   . Arthritis     Past Surgical History  Procedure Date  . Cardiac catheterization     1998  HIGH PT  . Back surgery     2010  . Cervical disc surgery     12/2008  . Cholecystectomy   . Abdominal hysterectomy     1996    Prescriptions prior to admission  Medication Sig Dispense Refill  . acetaminophen (TYLENOL) 650 MG CR tablet Take 1,300 mg by mouth 2 (two) times daily.      Marland Kitchen albuterol (PROVENTIL HFA;VENTOLIN HFA) 108 (90 BASE) MCG/ACT inhaler Inhale 2 puffs into the lungs every 6 (six) hours as needed. For shortness of breath      . aspirin EC 325 MG tablet Take 325 mg by mouth daily.      . cyclobenzaprine (FLEXERIL) 10 MG tablet Take 10 mg by mouth 2 (two) times daily as needed. For muscle spasms      . dicyclomine (BENTYL) 10 MG capsule Take 10 mg by mouth 4 (four) times daily as needed. For stomach spasms      . hydrochlorothiazide (HYDRODIURIL) 25 MG tablet Take 25 mg by mouth daily.      . metoprolol succinate (TOPROL-XL) 25 MG 24 hr tablet Take 25 mg by mouth daily.      Marland Kitchen omeprazole (PRILOSEC) 20 MG capsule Take 20 mg by mouth 2 (two) times daily.       No Known Allergies  History  Substance Use Topics  . Smoking status: Never Smoker   . Smokeless tobacco: Not on file  . Alcohol Use: Yes     OCC    History reviewed. No pertinent family history.  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

## 2011-12-12 NOTE — Preoperative (Signed)
Beta Blockers   Reason not to administer Beta Blockers:Toprol 3:30 a.m. today.

## 2011-12-12 NOTE — Anesthesia Postprocedure Evaluation (Signed)
  Anesthesia Post-op Note  Patient: [name]  Procedure(s) Performed: Procedure(s) (LRB): POSTERIOR LUMBAR FUSION 1 LEVEL (N/A)  Patient Location: PACU  Anesthesia Type: General  Level of Consciousness: awake, alert  and oriented  Airway and Oxygen Therapy: Patient Spontanous Breathing and Patient connected to nasal cannula oxygen  Post-op Pain: none  Post-op Assessment: Post-op Vital signs reviewed, Patient's Cardiovascular Status Stable, Respiratory Function Stable, Patent Airway, No signs of Nausea or vomiting and Pain level controlled  Post-op Vital Signs: Reviewed and stable  Complications: No apparent anesthesia complications

## 2011-12-12 NOTE — Anesthesia Procedure Notes (Signed)
Procedure Name: Intubation Date/Time: 12/12/2011 8:15 AM Performed by: Darcey Nora B Pre-anesthesia Checklist: Patient identified, Emergency Drugs available, Suction available and Patient being monitored Patient Re-evaluated:Patient Re-evaluated prior to inductionOxygen Delivery Method: Circle system utilized Preoxygenation: Pre-oxygenation with 100% oxygen Intubation Type: IV induction Ventilation: Mask ventilation without difficulty Laryngoscope Size: Mac and 3 Grade View: Grade I Tube type: Oral Tube size: 7.5 mm Number of attempts: 1 Airway Equipment and Method: Stylet Secured at: 21 (cm at teeth) cm Tube secured with: Tape Dental Injury: Teeth and Oropharynx as per pre-operative assessment

## 2011-12-12 NOTE — Transfer of Care (Signed)
Immediate Anesthesia Transfer of Care Note  Patient: [name]  Procedure(s) Performed: Procedure(s) (LRB): POSTERIOR LUMBAR FUSION 1 LEVEL (N/A)  Patient Location: PACU  Anesthesia Type: General  Level of Consciousness: awake, sedated and pateint uncooperative  Airway & Oxygen Therapy: Patient Spontanous Breathing and Patient connected to nasal cannula oxygen  Post-op Assessment: Report given to PACU RN, Post -op Vital signs reviewed and stable and Patient moving all extremities  Post vital signs: Reviewed and stable  Complications: No apparent anesthesia complications

## 2011-12-12 NOTE — Anesthesia Preprocedure Evaluation (Signed)
Anesthesia Evaluation  Patient identified by MRN, date of birth, ID band Patient awake    Reviewed: Allergy & Precautions, H&P , NPO status , Patient's Chart, lab work & pertinent test results, reviewed documented beta blocker date and time   History of Anesthesia Complications Negative for: history of anesthetic complications  Airway Mallampati: II TM Distance: >3 FB Neck ROM: Full    Dental  (+) Caps and Dental Advisory Given   Pulmonary asthma (daily inhalers) ,  breath sounds clear to auscultation  Pulmonary exam normal       Cardiovascular hypertension, Pt. on medications and Pt. on home beta blockers Rhythm:Regular Rate:Normal  Stress test '12: no ischemia, EF 55-60%   Neuro/Psych Chronic back pain, worse on L; tylenol negative psych ROS   GI/Hepatic Neg liver ROS, GERD-  Medicated and Controlled,  Endo/Other  negative endocrine ROS  Renal/GU negative Renal ROS     Musculoskeletal   Abdominal   Peds  Hematology negative hematology ROS (+)   Anesthesia Other Findings   Reproductive/Obstetrics                           Anesthesia Physical Anesthesia Plan  ASA: II  Anesthesia Plan: General   Post-op Pain Management:    Induction: Intravenous  Airway Management Planned: Oral ETT  Additional Equipment:   Intra-op Plan:   Post-operative Plan: Extubation in OR  Informed Consent: I have reviewed the patients History and Physical, chart, labs and discussed the procedure including the risks, benefits and alternatives for the proposed anesthesia with the patient or authorized representative who has indicated his/her understanding and acceptance.   Dental advisory given  Plan Discussed with: CRNA and Surgeon  Anesthesia Plan Comments: (Plan routine monitors, GETA)        Anesthesia Quick Evaluation

## 2011-12-13 LAB — CBC
MCH: 31.7 pg (ref 26.0–34.0)
MCHC: 35.6 g/dL (ref 30.0–36.0)
MCV: 89.1 fL (ref 78.0–100.0)
Platelets: 178 10*3/uL (ref 150–400)
RBC: 3.31 MIL/uL — ABNORMAL LOW (ref 3.87–5.11)
RDW: 12.9 % (ref 11.5–15.5)

## 2011-12-13 LAB — BASIC METABOLIC PANEL
CO2: 24 mEq/L (ref 19–32)
Calcium: 8.8 mg/dL (ref 8.4–10.5)
Creatinine, Ser: 0.66 mg/dL (ref 0.50–1.10)
GFR calc non Af Amer: 88 mL/min — ABNORMAL LOW (ref >90)
Sodium: 135 mEq/L (ref 135–145)

## 2011-12-13 MED ORDER — MORPHINE SULFATE 2 MG/ML IJ SOLN
1.0000 mg | INTRAMUSCULAR | Status: DC | PRN
  Administered 2011-12-13: 2 mg via INTRAVENOUS
  Filled 2011-12-13: qty 1

## 2011-12-13 MED ORDER — OXYCODONE-ACETAMINOPHEN 5-325 MG PO TABS
1.0000 | ORAL_TABLET | ORAL | Status: DC | PRN
  Administered 2011-12-13 – 2011-12-16 (×13): 2 via ORAL
  Filled 2011-12-13 (×13): qty 2

## 2011-12-13 MED ORDER — HYDROCODONE-ACETAMINOPHEN 10-325 MG PO TABS: 0.5000 | ORAL_TABLET | ORAL | Status: DC | PRN

## 2011-12-13 NOTE — Clinical Documentation Improvement (Signed)
Anemia Blood Loss Clarification  THIS DOCUMENT IS NOT A PERMANENT PART OF THE MEDICAL RECORD         12/13/11  Dear Dr.Hirsch Marton Redwood  In an effort to better capture your patient's severity of illness, reflect appropriate length of stay and utilization of resources, a review of the patient medical record has revealed the following indicators.   Based on your clinical judgment, please clarify and document in a progress note and/or discharge summary the clinical condition associated with the following supporting information: In responding to this query please exercise your independent judgment.  The fact that a query is asked, does not imply that any particular answer is desired or expected.   Hi Dr. Phoebe Perch!  Please consider the below (if your clinical findings/judgment agree) as you document the patient's diagnosis/condition(s) in the progress note and discharge summary. Thank you!  Possible Clinical Conditions?  - Expected Intraoperative Acute Blood Loss Anemia  - Acute Blood Loss Anemia  - Other condition (please document in the progress notes and/or discharge summary)  - Cannot Clinically determine at this time   Supporting Information:  - 5/30 POSTERIOR LUMBAR FUSION 1 LEVEL  - EBL=435mls  - Cell saver given intra-op=132mls  Component Hemoglobin HCT  Latest Ref Rng 12.0 - 15.0 g/dL 16.1 - 09.6 %  0/45/4098 14.3 40.1  12/13/2011 10.5 (L) 29.5 (L)     No additional documentation in chart upon review. SM    Thank You,  Saul Fordyce  Clinical Documentation Specialist: [phone] Pager  [facility] [place]

## 2011-12-13 NOTE — Care Management Note (Signed)
    Page 1 of 1   12/17/2011     2:09:46 PM   CARE MANAGEMENT NOTE 12/17/2011  Patient:  [name], [name]   Account Number:  [id number]  Date Initiated:  12/13/2011  Documentation initiated by:  Onnie Boer  Subjective/Objective Assessment:   PT WAS ADMITTED FOR SURG     Action/Plan:   PROGRESSION OF CARE AND DISCHARGE PLANNING   Anticipated DC Date:  12/16/2011   Anticipated DC Plan:  HOME W HOME HEALTH SERVICES      DC Planning Services  CM consult      Choice offered to / List presented to:             Status of service:  Completed, signed off Medicare Important Message given?   (If response is "NO", the following Medicare IM given date fields will be blank) Date Medicare IM given:   Date Additional Medicare IM given:    Discharge Disposition:  HOME/SELF CARE  Per UR Regulation:  Reviewed for med. necessity/level of care/duration of stay  If discussed at Long Length of Stay Meetings, dates discussed:    Comments:  12/17/11 Onnie Boer, RN, BSN 1409 PT WAS DC'D TO HOME WITH SELF CARE  12/16/11 Onnie Boer, RN, BSN 1311 SPOKE WITH PT AS PHYSICAL THERAPY HAS SUGGESTED HH PT/OT. PT STATED THAT SHE WANTS OP PT/OT AND THAT SHE HAS HAD THIS IN THE PAST.  WILL ARRANGE OP PT/OT WITH [facility] ONCE ORDER IS RECEIVED.  WILL F/U.  12/13/11 Onnie Boer, RN, BSN 1143 PT WAS ADMITTED FOR FUSION AND IS ON BEDREST UNTIL TOMORROW.  PTA PT WAS AT HOME WITH SELF/ FAMILY CARE.  WILL F/U ON DC NEEDS

## 2011-12-13 NOTE — Progress Notes (Signed)
PT Cancellation Note     Treatment cancelled today due to pt on 1 more day of flat bedrest  12/13/2011  [place] Bing, PT [phone] (pager)

## 2011-12-13 NOTE — Progress Notes (Signed)
OT Cancellation Note  Treatment cancelled today due to pt is currently on bedrest.  Will initiate OT eval when MD approves increased activity.[name] [phone] 12/13/2011, 11:37 AM

## 2011-12-13 NOTE — Progress Notes (Signed)
Doing well. C/o appropriate incisional soreness. No leg pain No Numbness, tingling, weakness No Nausea /vomiting No HA  Temp:  [97.4 F (36.3 C)-98.6 F (37 C)] 97.4 F (36.3 C) (05/31 0600) Pulse Rate:  [67-86] 78  (05/31 0600) Resp:  [9-22] 18  (05/31 0749) BP: (97-136)/(44-65) 97/55 mmHg (05/31 0600) SpO2:  [97 %-100 %] 100 % (05/31 0749) Weight:  [70.5 kg (155 lb 6.8 oz)] 70.5 kg (155 lb 6.8 oz) (05/30 1805) Good strength and sensation Incision CDI  Plan: Start mobilizing in am

## 2011-12-14 NOTE — Progress Notes (Signed)
Patient ID: [name], female   DOB: [birth year], 71 y.o.   MRN: [id number] Subjective: Patient reports Ready to get out of bed  Objective: Vital signs in last 24 hours: Temp:  [97.8 F (36.6 C)-98.6 F (37 C)] 98.2 F (36.8 C) (06/01 0600) Pulse Rate:  [61-86] 85  (06/01 0600) Resp:  [18] 18  (06/01 0600) BP: (107-126)/(54-80) 120/80 mmHg (06/01 0600) SpO2:  [96 %-100 %] 98 % (06/01 0600)  Intake/Output from previous day: 05/31 0701 - 06/01 0700 In: 360 [P.O.:360] Out: 2725 [Urine:2725] Intake/Output this shift:    Wound:Clean and dry  Lab Results:  [facility] 12/13/11 0902  WBC 12.8*  HGB 10.5*  HCT 29.5*  PLT 178   BMET  Basename 12/13/11 0902  NA 135  K 4.0  CL 100  CO2 24  GLUCOSE 98  BUN 12  CREATININE 0.66  CALCIUM 8.8    Studies/Results: Dg Lumbar Spine 2-3 Views  12/12/2011  *RADIOLOGY REPORT*  Clinical Data: Interbody fusion.  DG C-ARM 1-60 MIN,LUMBAR SPINE - 2-3 VIEW  Comparison:  MRI of 11/04/2011 and plain film of 10/25/2011.  Findings: AP and lateral views.  Demonstrate trans pedicle screw fixation and 2 lumbar levels.  This cannot be localized secondary to the exclusion of the sacrum or thoracolumbar junction.  Tissue expanders.  Imaged vertebral body height maintained.  IMPRESSION: Intraoperative imaging.  Original Report Authenticated By: Consuello Bossier, M.D.   Dg C-arm 1-60 Min  12/12/2011  *RADIOLOGY REPORT*  Clinical Data: Interbody fusion.  DG C-ARM 1-60 MIN,LUMBAR SPINE - 2-3 VIEW  Comparison:  MRI of 11/04/2011 and plain film of 10/25/2011.  Findings: AP and lateral views.  Demonstrate trans pedicle screw fixation and 2 lumbar levels.  This cannot be localized secondary to the exclusion of the sacrum or thoracolumbar junction.  Tissue expanders.  Imaged vertebral body height maintained.  IMPRESSION: Intraoperative imaging.  Original Report Authenticated By: Consuello Bossier, M.D.    Assessment/Plan: Doing well. Will slowly start to increase  activity today  LOS: 2 days  As above   Reinaldo Meeker, MD 12/14/2011, 9:19 AM

## 2011-12-14 NOTE — Progress Notes (Signed)
Occupational Therapy Evaluation Patient Details Name: [name] MRN: [id number] DOB: October 03, 1940 Today's Date: 12/14/2011 Time: 5284-1324 OT Time Calculation (min): 20 min  OT Assessment / Plan / Recommendation Clinical Impression  Pt presents s/p PLIF L2-3 who has been on prolonged bed rest. RN present upon sitting and requested that pt gradually adjust to being upright and to wait to perform OOB transfer.  Will benefit from acute OT to address below problem list in prep for d/c home with spouse.    OT Assessment  Patient needs continued OT Services    Follow Up Recommendations  Supervision/Assistance - 24 hour;Home health OT    Barriers to Discharge      Equipment Recommendations  None recommended by OT    Recommendations for Other Services    Frequency  Min 2X/week    Precautions / Restrictions Precautions Precautions: Back Precaution Booklet Issued: Yes (comment) Precaution Comments: pt educated on 3/3 back precautions Required Braces or Orthoses: Spinal Brace Spinal Brace: Thoracolumbosacral orthotic;Applied in sitting position Restrictions Weight Bearing Restrictions: No   Pertinent Vitals/Pain See vitals.    ADL  Lower Body Bathing: Simulated;Maximal assistance Where Assessed - Lower Body Bathing: Unsupported sitting Upper Body Dressing: Performed;Set up Where Assessed - Upper Body Dressing: Unsupported sitting Lower Body Dressing: Simulated;Maximal assistance Where Assessed - Lower Body Dressing: Unsupported sitting Equipment Used: Back brace Transfers/Ambulation Related to ADLs: Pt with increased dizziness upon sitting EOB.  ADL Comments: Pt's spouse able to demonstrate independence with assisting pt with donning/doffing TLSO brace sitting EOB. Session limited by need for gradual mobility and pt with increased dizziness.    OT Diagnosis: Generalized weakness;Acute pain  OT Problem List: Decreased activity tolerance;Decreased knowledge of use of DME or  AE;Decreased knowledge of precautions;Pain OT Treatment Interventions: Self-care/ADL training;DME and/or AE instruction;Therapeutic activities;Patient/family education   OT Goals Acute Rehab OT Goals OT Goal Formulation: With patient Time For Goal Achievement: 12/21/11 Potential to Achieve Goals: Good ADL Goals Pt Will Perform Grooming: with supervision;Standing at sink ADL Goal: Grooming - Progress: Goal set today Pt Will Perform Lower Body Bathing: with supervision;Sit to stand from chair;Sit to stand from bed;with adaptive equipment ADL Goal: Lower Body Bathing - Progress: Goal set today Pt Will Perform Lower Body Dressing: with supervision;with adaptive equipment;Sit to stand from chair;Sit to stand from bed ADL Goal: Lower Body Dressing - Progress: Goal set today Pt Will Perform Tub/Shower Transfer: Tub transfer;with supervision;Ambulation;with DME;Shower seat with back;Maintaining back safety precautions ADL Goal: Tub/Shower Transfer - Progress: Goal set today Additional ADL Goal #1: Pt will perform all components of toileting ADL with supervision. ADL Goal: Additional Goal #1 - Progress: Goal set today Miscellaneous OT Goals Miscellaneous OT Goal #1: Pt will independently verbalize and demonstrate 3/3 back safety precautions during all ADL activity, OT Goal: Miscellaneous Goal #1 - Progress: Goal set today Miscellaneous OT Goal #2: Pt will perform bed mobility with supervision in prep for EOB ADLs. OT Goal: Miscellaneous Goal #2 - Progress: Goal set today  Visit Information  Last OT Received On: 12/14/11 Assistance Needed: +2    Subjective Data      Prior Functioning  Home Living Lives With: Spouse Available Help at Discharge: Family;Available 24 hours/day Type of Home: House Home Access: Stairs to enter [employer] of Steps: 3 Entrance Stairs-Rails: None Home Layout: One level Bathroom Shower/Tub: Forensic scientist: Handicapped  height Bathroom Accessibility: Yes How Accessible: Accessible via walker Home Adaptive Equipment: Straight cane;Bedside commode/3-in-1;Shower chair with back;Walker - rolling Prior  Function Level of Independence: Independent with assistive device(s) (cane majority of the time) Able to Take Stairs?: Yes Driving: Yes Vocation: Retired Musician: No difficulties Dominant Hand: Right    Cognition  Overall Cognitive Status: Appears within functional limits for tasks assessed/performed Arousal/Alertness: Awake/alert Orientation Level: Appears intact for tasks assessed Behavior During Session: [facility] for tasks performed    Extremity/Trunk Assessment Right Upper Extremity Assessment RUE ROM/Strength/Tone: Within functional levels Left Upper Extremity Assessment LUE ROM/Strength/Tone: Within functional levels Right Lower Extremity Assessment RLE ROM/Strength/Tone: Within functional levels RLE Sensation: WFL - Light Touch Left Lower Extremity Assessment LLE ROM/Strength/Tone: Within functional levels LLE Sensation: WFL - Light Touch   Mobility Bed Mobility Bed Mobility: Rolling Left;Left Sidelying to Sit;Sitting - Scoot to [place] of Bed;Sit to Sidelying Left;Scooting to [facility] Rolling Left: 3: Mod assist;With rail Left Sidelying to Sit: 3: Mod assist;With rails;HOB flat Sitting - Scoot to Edge of Bed: 3: Mod assist Sit to Sidelying Left: 3: Mod assist;HOB flat;With rail Scooting to [facility]: 1: +1 Total assist Details for Bed Mobility Assistance: VC for proper sequencing to maintain back precautions throughout. Assist with LEs out of bed and RLE back into bed. Cueing for hand placement throughout for safety. Total assist towards HOB with drawsheet to maintain pt in comfortable and safe position during transfer.   Exercise    Balance    End of Session OT - End of Session Equipment Utilized During Treatment: Gait belt;Back brace Activity Tolerance: Other (comment) (limited by  dizziness) Patient left: in bed;with call bell/phone within reach;with family/visitor present Nurse Communication: Mobility status (BP readings)  12/14/2011 Cipriano Mile OTR/L Pager [phone] Office [phone]  Cipriano Mile 12/14/2011, 10:54 AM

## 2011-12-14 NOTE — Evaluation (Signed)
Physical Therapy Evaluation Patient Details Name: [name] MRN: [id number] DOB: 05-01-1941 Today's Date: 12/14/2011 Time: 1191-4782 PT Time Calculation (min): 20 min  PT Assessment / Plan / Recommendation Clinical Impression  Pt presents s/p PLIF L2-3 who has been on prolonged bed rest. RN present upon sitting and requested that pt gradually adjust to being upright and to wait to ambulate. Will continue to reassess in further sessions. Pt will benefit from skilled PT in the acute care setting in order to maximize functional mobility and safety prior to d/c     PT Assessment  Patient needs continued PT services    Follow Up Recommendations  Home health PT;Supervision for mobility/OOB    Barriers to Discharge        lEquipment Recommendations  None recommended by PT    Recommendations for Other Services     Frequency Min 5X/week    Precautions / Restrictions Precautions Precautions: Back Precaution Booklet Issued: Yes (comment) Precaution Comments: pt educated on 3/3 back precautions Required Braces or Orthoses: Spinal Brace Spinal Brace: Thoracolumbosacral orthotic;Applied in sitting position Restrictions Weight Bearing Restrictions: No         Mobility  Bed Mobility Bed Mobility: Rolling Left;Left Sidelying to Sit;Sitting - Scoot to [place] of Bed;Sit to Sidelying Left;Scooting to [facility] Rolling Left: 3: Mod assist;With rail Left Sidelying to Sit: 3: Mod assist;With rails;HOB flat Sitting - Scoot to Edge of Bed: 3: Mod assist Sit to Sidelying Left: 3: Mod assist;HOB flat;With rail Scooting to [facility]: 1: +1 Total assist Details for Bed Mobility Assistance: VC for proper sequencing to maintain back precautions throughout. Assist with LEs out of bed and RLE back into bed. Cueing for hand placement throughout for safety. Total assist towards HOB with drawsheet to maintain pt in comfortable and safe position during transfer. Transfers Transfers: Not  assessed Ambulation/Gait Ambulation/Gait Assistance: Not tested (comment)    Exercises     PT Diagnosis: Acute pain;Difficulty walking  PT Problem List: Decreased activity tolerance;Decreased mobility;Decreased knowledge of use of DME;Decreased safety awareness;Decreased knowledge of precautions;Pain PT Treatment Interventions: DME instruction;Gait training;Stair training;Functional mobility training;Therapeutic activities;Patient/family education   PT Goals Acute Rehab PT Goals PT Goal Formulation: With patient Time For Goal Achievement: 12/21/11 Potential to Achieve Goals: Fair Pt will Roll Supine to Right Side: with modified independence PT Goal: Rolling Supine to Right Side - Progress: Goal set today Pt will Roll Supine to Left Side: with modified independence PT Goal: Rolling Supine to Left Side - Progress: Goal set today Pt will go Supine/Side to Sit: with modified independence PT Goal: Supine/Side to Sit - Progress: Goal set today Pt will go Sit to Supine/Side: with modified independence PT Goal: Sit to Supine/Side - Progress: Goal set today Pt will go Sit to Stand: with supervision PT Goal: Sit to Stand - Progress: Goal set today Pt will go Stand to Sit: with supervision PT Goal: Stand to Sit - Progress: Goal set today Pt will Transfer Bed to Chair/Chair to Bed: with supervision PT Transfer Goal: Bed to Chair/Chair to Bed - Progress: Goal set today Pt will Ambulate: 51 - 150 feet;with supervision;with least restrictive assistive device PT Goal: Ambulate - Progress: Goal set today Pt will Go Up / Down Stairs: 3-5 stairs;with min assist;with least restrictive assistive device PT Goal: Up/Down Stairs - Progress: Goal set today  Visit Information  Last PT Received On: 12/14/11 Assistance Needed: +2 PT/OT Co-Evaluation/Treatment: Yes    Subjective Data      Prior Functioning  Home  Living Lives With: Spouse Available Help at Discharge: Family;Available 24  hours/day Type of Home: House Home Access: Stairs to enter [employer] of Steps: 3 Entrance Stairs-Rails: None Home Layout: One level Bathroom Shower/Tub: Forensic scientist: Handicapped height Bathroom Accessibility: Yes How Accessible: Accessible via walker Home Adaptive Equipment: Straight cane;Bedside commode/3-in-1;Shower chair with back;Walker - rolling Prior Function Level of Independence: Independent with assistive device(s) (cane majority of the time) Able to Take Stairs?: Yes Driving: Yes Vocation: Retired Musician: No difficulties Dominant Hand: Right    Cognition  Overall Cognitive Status: Appears within functional limits for tasks assessed/performed Arousal/Alertness: Awake/alert Orientation Level: Appears intact for tasks assessed Behavior During Session: [facility] for tasks performed    Extremity/Trunk Assessment Right Lower Extremity Assessment RLE ROM/Strength/Tone: Within functional levels RLE Sensation: WFL - Light Touch Left Lower Extremity Assessment LLE ROM/Strength/Tone: Within functional levels LLE Sensation: WFL - Light Touch   Balance    End of Session PT - End of Session Equipment Utilized During Treatment: Back brace Activity Tolerance: Patient limited by fatigue Patient left: in bed;with call bell/phone within reach;with nursing in room;with family/visitor present Nurse Communication: Mobility status   Milana Kidney 12/14/2011, 10:34 AM  12/14/2011 Milana Kidney DPT PAGER: [phone] OFFICE: [phone]

## 2011-12-15 NOTE — Progress Notes (Signed)
BP 129/83  Pulse 91  Temp(Src) 98.1 F (36.7 C) (Oral)  Resp 20  Ht 5\' 2"  (1.575 m)  Wt 70.5 kg (155 lb 6.8 oz)  BMI 28.43 kg/m2  SpO2 97% Alert and oriented x 4 Speech clear and fluent.  5/5 strength in lower extremities Wound clean, dry, and without signs of infection.  Better today, will possibly leave tomorrow.

## 2011-12-15 NOTE — Progress Notes (Signed)
Physical Therapy Treatment Patient Details Name: [name] MRN: [id number] DOB: 06/25/1941 Today's Date: 12/15/2011 Time: 4098-1191 PT Time Calculation (min): 26 min  PT Assessment / Plan / Recommendation Comments on Treatment Session  Increased activity tolerance today with OOB activity performed. Pt also needed less assitance today with mobility. Still with reports of pain and overall fatique.    Follow Up Recommendations  Home health PT;Supervision for mobility/OOB       Equipment Recommendations  None recommended by OT       Frequency Min 5X/week   Plan Discharge plan remains appropriate;Frequency remains appropriate    Precautions / Restrictions Precautions Precautions: Back Precaution Comments: Pt able to recall 1/3 back precautions. Educated pt on all three precauitions. Also provided pt with education on sleeping postures and use of pillows for positioning, as well as posture/body mechanice with basic adl's to prevent future back injury/pain. handouts provided with written instuction and illustrations.               Spinal Brace: Thoracolumbosacral orthotic;Applied in sitting position (total assit to doff brace at edge of bed.)       Mobility  Bed Mobility Rolling Right: 5: Supervision;Other (comment) (HOB flat) Sit to Sidelying Left: 4: Min guard;HOB flat Details for Bed Mobility Assistance: pt required min guard assist to supervision to lie down onto left side and then roll onto her back. cues for technique (keep kness/hips/shoulders moving together as an unit. Increased time required for pt to perform at her optimal best. Transfers Transfers: Sit to Stand;Stand to Sit Sit to Stand: 4: Min guard;From chair/3-in-1;With upper extremity assist;With armrests Stand to Sit: 4: Min guard;To bed;With upper extremity assist Details for Transfer Assistance: cues for ant weight shifting to assist with standing and for safe hand placement with  transfer. Ambulation/Gait Ambulation/Gait Assistance: 4: Min assist Ambulation Distance (Feet): 10 Feet Assistive device: 1 person hand held assist (bil hand held assistance.) Ambulation/Gait Assistance Details: cues for posture and increased step lenght and increased DF for increased foot clearance with stepping. No buckling noted with steps from chair to bed. Gait Pattern: Step-through pattern;Decreased stride length;Decreased step length - right;Decreased step length - left;Shuffle Gait velocity: Decreased         PT Goals Acute Rehab PT Goals PT Goal: Sit to Supine/Side - Progress: Progressing toward goal PT Goal: Sit to Stand - Progress: Progressing toward goal PT Goal: Stand to Sit - Progress: Progressing toward goal PT Transfer Goal: Bed to Chair/Chair to Bed - Progress: Progressing toward goal PT Goal: Ambulate - Progress: Progressing toward goal  Visit Information  Last PT Received On: 12/15/11 Assistance Needed: +1    Subjective Data  Subjective: Agreeable to limited therapy in room only. "I am not walking today".   Cognition  Overall Cognitive Status: Appears within functional limits for tasks assessed/performed Arousal/Alertness: Awake/alert Orientation Level: Appears intact for tasks assessed Behavior During Session: [facility] for tasks performed       End of Session PT - End of Session Equipment Utilized During Treatment: Gait belt;Back brace Activity Tolerance: Patient tolerated treatment well;Patient limited by pain;Patient limited by fatigue Patient left: in bed;with call bell/phone within reach Nurse Communication: Mobility status    Sallyanne Kuster 12/15/2011, 2:12 PM  Sallyanne Kuster, PTA Office- [phone]

## 2011-12-16 NOTE — Progress Notes (Signed)
Patient had a walk with me around the circle and she did pretty good , no dizziness or headaches complained by her.

## 2011-12-16 NOTE — Progress Notes (Signed)
Physical Therapy Treatment Patient Details Name: [name] MRN: [id number] DOB: April 19, 1941 Today's Date: 12/16/2011 Time: 4098-1191 PT Time Calculation (min): 19 min  PT Assessment / Plan / Recommendation Comments on Treatment Session  Patient able to increase ambulation however patient requested to turn around and go to restroom. Patients session limited due to restroom use.     Follow Up Recommendations  Home health PT;Supervision for mobility/OOB    Barriers to Discharge        Equipment Recommendations  None recommended by OT;None recommended by PT    Recommendations for Other Services    Frequency Min 5X/week   Plan Discharge plan remains appropriate;Frequency remains appropriate    Precautions / Restrictions Precautions Precautions: Back Precaution Comments: Patient required cues for recall of precautions. Pt just states, "I know what to do not to hurt myself" Spinal Brace: Applied in sitting position;Thoracolumbosacral orthotic   Pertinent Vitals/Pain     Mobility  Bed Mobility Bed Mobility: Not assessed Transfers Sit to Stand: 3: Mod assist;With upper extremity assist;With armrests;From chair/3-in-1 Stand to Sit: 4: Min assist;With armrests;To toilet;With upper extremity assist Details for Transfer Assistance: Patient had been sitting a while and stated she felt really stiff. A to initiate stand. Cues for safe hand placement Ambulation/Gait Ambulation/Gait Assistance: 4: Min assist Ambulation Distance (Feet): 120 Feet Assistive device: Rolling walker Ambulation/Gait Assistance Details: Cues for posture and safe use of RW. Patient able to start clearing L foot with ambulation Gait Pattern: Step-through pattern;Decreased stride length;Trunk flexed    Exercises     PT Diagnosis:    PT Problem List:   PT Treatment Interventions:     PT Goals Acute Rehab PT Goals PT Goal: Sit to Stand - Progress: Progressing toward goal PT Goal: Stand to Sit - Progress:  Progressing toward goal PT Transfer Goal: Bed to Chair/Chair to Bed - Progress: Progressing toward goal PT Goal: Ambulate - Progress: Progressing toward goal PT Goal: Up/Down Stairs - Progress: Progressing toward goal  Visit Information  Last PT Received On: 12/16/11 Assistance Needed: +1    Subjective Data      Cognition  Overall Cognitive Status: Appears within functional limits for tasks assessed/performed Arousal/Alertness: Awake/alert Orientation Level: Appears intact for tasks assessed Behavior During Session: [facility] for tasks performed    Balance     End of Session PT - End of Session Equipment Utilized During Treatment: Gait belt;Back brace Activity Tolerance: Patient tolerated treatment well Patient left: in chair;with call bell/phone within reach;Other (comment) (on toilet) Nurse Communication: Mobility status    Fredrich Birks 12/16/2011, 2:38 PM 12/16/2011 Fredrich Birks PTA [phone] pager [phone] office

## 2011-12-16 NOTE — Progress Notes (Signed)
Doing well. C/o appropriate incisional soreness.  No Numbness, tingling, weakness No Nausea /vomiting Amb in room  Temp:  [98.1 F (36.7 C)-98.9 F (37.2 C)] 98.8 F (37.1 C) (06/03 0952) Pulse Rate:  [80-97] 97  (06/03 0952) Resp:  [16-18] 16  (06/03 0952) BP: (96-129)/(64-80) 96/64 mmHg (06/03 0952) SpO2:  [92 %-98 %] 92 % (06/03 0952) Good strength and sensation Incision CDI  Plan: Increase activity

## 2011-12-17 MED ORDER — OXYCODONE-ACETAMINOPHEN 5-325 MG PO TABS: 1.0000 | ORAL_TABLET | ORAL | Status: AC | PRN

## 2011-12-17 MED FILL — Heparin Sodium (Porcine) Inj 1000 Unit/ML: INTRAMUSCULAR | Qty: 30 | Status: AC

## 2011-12-17 MED FILL — Sodium Chloride Irrigation Soln 0.9%: Qty: 3000 | Status: AC

## 2011-12-17 MED FILL — Sodium Chloride IV Soln 0.9%: INTRAVENOUS | Qty: 1000 | Status: AC

## 2011-12-17 NOTE — Progress Notes (Signed)
Physical Therapy Treatment Patient Details Name: [name] MRN: [id number] DOB: [birth year] Today's Date: 12/17/2011 Time: 5621-3086 PT Time Calculation (min): 14 min  PT Assessment / Plan / Recommendation Comments on Treatment Session  Patient progressing well. Husband in room and assisting patient with brace wear and mobility    Follow Up Recommendations  Home health PT;Supervision for mobility/OOB    Barriers to Discharge        Equipment Recommendations  None recommended by PT;None recommended by OT    Recommendations for Other Services    Frequency Min 5X/week   Plan Discharge plan remains appropriate    Precautions / Restrictions Precautions Precautions: Back Precaution Comments: Patient able to recall precautions this session Required Braces or Orthoses: Spinal Brace Spinal Brace: Thoracolumbosacral orthotic;Applied in sitting position   Pertinent Vitals/Pain     Mobility  Transfers Sit to Stand: 6: Modified independent (Device/Increase time) Stand to Sit: 6: Modified independent (Device/Increase time) Ambulation/Gait Ambulation/Gait Assistance: 5: Supervision Ambulation Distance (Feet): 260 Feet Assistive device: Rolling walker Ambulation/Gait Assistance Details: Cues for posture and safety with RW Gait Pattern: Step-through pattern;Decreased stride length;Trunk flexed Stairs: Yes Stairs Assistance: 5: Supervision Stair Management Technique: One rail Right Number of Stairs: 5     Exercises     PT Diagnosis:    PT Problem List:   PT Treatment Interventions:     PT Goals Acute Rehab PT Goals PT Goal: Sit to Stand - Progress: Met PT Goal: Stand to Sit - Progress: Met PT Transfer Goal: Bed to Chair/Chair to Bed - Progress: Met PT Goal: Ambulate - Progress: Met PT Goal: Up/Down Stairs - Progress: Met  Visit Information  Last PT Received On: 12/17/11 Assistance Needed: +1    Subjective Data  Subjective: I finally feel better. Friday was horrible     Cognition  Overall Cognitive Status: Appears within functional limits for tasks assessed/performed Arousal/Alertness: Awake/alert Orientation Level: Appears intact for tasks assessed Behavior During Session: [facility] for tasks performed    Balance     End of Session PT - End of Session Equipment Utilized During Treatment: Gait belt;Back brace Activity Tolerance: Patient tolerated treatment well Patient left: in chair Nurse Communication: Mobility status    Fredrich Birks 12/17/2011, 12:32 PM 12/17/2011 Fredrich Birks PTA [phone] pager [phone] office

## 2011-12-17 NOTE — Discharge Summary (Signed)
Physician Discharge Summary  Patient ID: [name] MRN: [id number] DOB/AGE: [birth year] 71 y.o.  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 *    Discharged Condition: good  Hospital Course: pt admitted day of surgery - underwent procedure below - dural rent occurred and closed primarily at surgery, but pt kept at flat bedrest  For couple days  , than increased activity -  Ambulating well , less leg pain, min incis pain - no sign csf drainage  Consults: None  Significant Diagnostic Studies: none  Treatments: surgery: Procedure(s):redo lumbar lam decompressing L2 and L3 roots (2levels) , PLIF L2-3, interbody cages L2-3, nonsegmented pedicle screw fixation L2-3 (expedium), postlateral fusion L2-3. Autograft, allograft, bonemarrow aspirate  POSTERIOR LUMBAR FUSION 1 LEVEL   Discharge Exam: Blood pressure 109/71, pulse 94, temperature 97.8 F (36.6 C), temperature source Oral, resp. rate 20, height 5\' 2"  (1.575 m), weight 70.5 kg (155 lb 6.8 oz), SpO2 97.00%. Wound:c/d/i  Disposition: home   Medication List  As of 12/17/2011 10:34 AM   TAKE these medications         acetaminophen 650 MG CR tablet   Commonly known as: TYLENOL   Take 1,300 mg by mouth 2 (two) times daily.      albuterol 108 (90 BASE) MCG/ACT inhaler   Commonly known as: PROVENTIL HFA;VENTOLIN HFA   Inhale 2 puffs into the lungs every 6 (six) hours as needed. For shortness of breath      aspirin EC 325 MG tablet   Take 325 mg by mouth daily.      cyclobenzaprine 10 MG tablet   Commonly known as: FLEXERIL   Take 10 mg by mouth 2 (two) times daily as needed. For muscle spasms      dicyclomine 10 MG capsule   Commonly known as:  BENTYL   Take 10 mg by mouth 4 (four) times daily as needed. For stomach spasms      hydrochlorothiazide 25 MG tablet   Commonly known as: HYDRODIURIL   Take 25 mg by mouth daily.      metoprolol succinate 25 MG 24 hr tablet   Commonly known as: TOPROL-XL   Take 25 mg by mouth daily.      omeprazole 20 MG capsule   Commonly known as: PRILOSEC   Take 20 mg by mouth 2 (two) times daily.      oxyCODONE-acetaminophen 5-325 MG per tablet   Commonly known as: PERCOCET   Take 1-2 tablets by mouth every 4 (four) hours as needed.             SignedClydene Fake, MD 12/17/2011, 10:34 AM

## 2011-12-17 NOTE — Progress Notes (Signed)
Occupational Therapy Treatment Patient Details Name: [name] MRN: [id number] DOB: 26-Feb-1941 Today's Date: 12/17/2011 Time: 3086-5784 OT Time Calculation (min): 10 min  OT Assessment / Plan / Recommendation Comments on Treatment Session Pt progressing well. To d/c today    Follow Up Recommendations  Supervision/Assistance - 24 hour;Home health OT    Barriers to Discharge       Equipment Recommendations  None recommended by PT;None recommended by OT    Recommendations for Other Services    Frequency     Plan Discharge plan remains appropriate    Precautions / Restrictions Precautions Precautions: Back Precaution Comments: Patient able to recall precautions this session Required Braces or Orthoses: Spinal Brace Spinal Brace: Thoracolumbosacral orthotic;Applied in sitting position   Pertinent Vitals/Pain Pt with no reports of pain    ADL  Tub/Shower Transfer: Performed;Minimal assistance Tub/Shower Transfer Method: Science writer: Shower seat without back Equipment Used: Back brace;Rolling walker Transfers/Ambulation Related to ADLs: Supervision with RW ambulation ADL Comments: Pt husband assisted with donning brace at EOB. Pt progressing well    OT Diagnosis:    OT Problem List:   OT Treatment Interventions:     OT Goals ADL Goals ADL Goal: Lower Body Bathing - Progress: Progressing toward goals ADL Goal: Lower Body Dressing - Progress: Progressing toward goals ADL Goal: Tub/Shower Transfer - Progress: Progressing toward goals  Visit Information  Last OT Received On: 12/17/11 Assistance Needed: +1    Subjective Data      Prior Functioning       Cognition  Overall Cognitive Status: Appears within functional limits for tasks assessed/performed Arousal/Alertness: Awake/alert Orientation Level: Appears intact for tasks assessed Behavior During Session: [facility] for tasks performed    Mobility Bed Mobility Bed Mobility: Not  assessed Transfers Sit to Stand: 6: Modified independent (Device/Increase time) Stand to Sit: 6: Modified independent (Device/Increase time) Details for Transfer Assistance: Pt with good hand placement   Exercises    Balance    End of Session OT - End of Session Equipment Utilized During Treatment: Gait belt;Back brace Activity Tolerance: Patient tolerated treatment well Patient left: in bed;with call bell/phone within reach;with family/visitor present Nurse Communication: Mobility status   [name] 12/17/2011, 12:43 PM

## 2013-10-07 IMAGING — CR DG CHEST 2V
2 series · 2 of 2 positions shown · non-contrast
Comparison: July 13, 2008

CLINICAL DATA: Preoperative respiratory exam; lumbar fusion

CHEST - 2 VIEW

[view not recorded (1 of 2)]
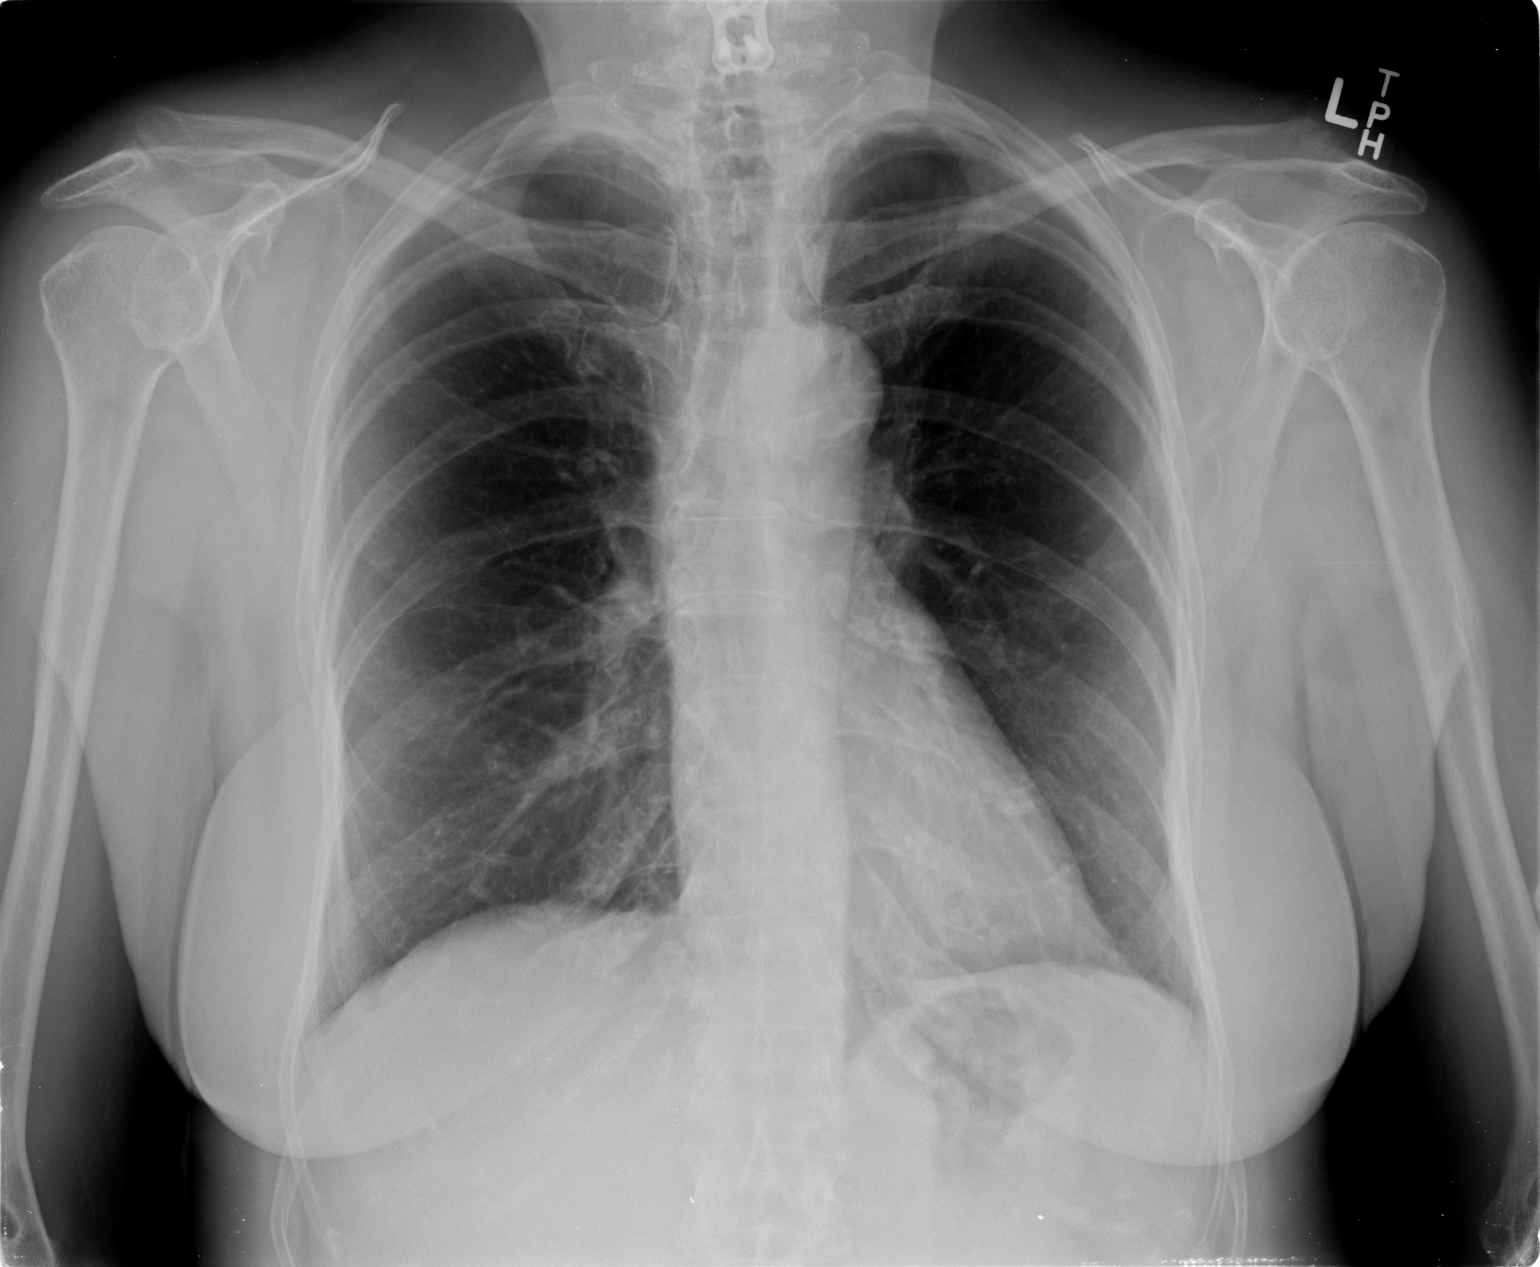

[view not recorded (2 of 2)]
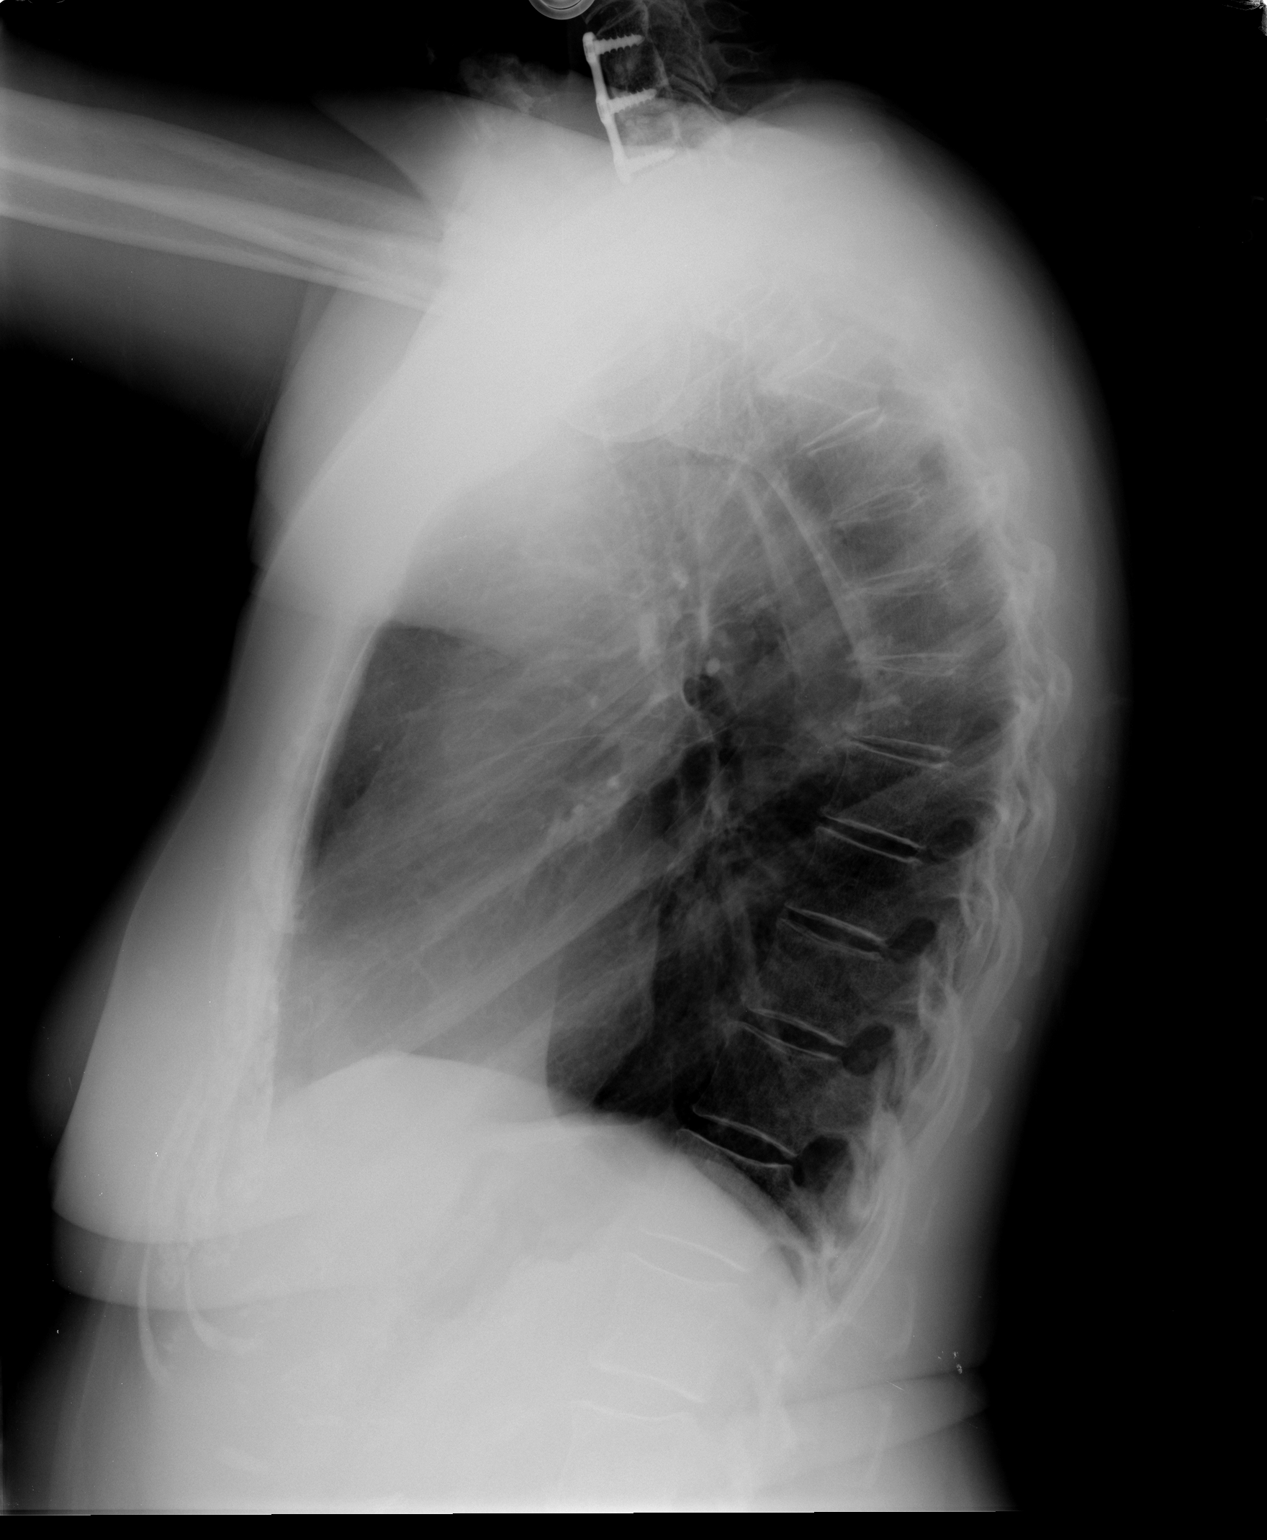

[2 of 2 positions shown; findings below may reference images not displayed]

FINDINGS: The cardiac silhouette, mediastinum, pulmonary
vasculature are within normal limits.  Both lungs are clear.
There is no acute bony abnormality.
IMPRESSION: There is no evidence of acute cardiac or pulmonary process.

## 2017-11-11 ENCOUNTER — Encounter: Payer: Self-pay | Admitting: Internal Medicine

## 2019-06-02 ENCOUNTER — Telehealth: Payer: Self-pay | Admitting: Cardiology

## 2019-06-02 ENCOUNTER — Telehealth: Payer: Self-pay

## 2019-06-02 NOTE — Telephone Encounter (Signed)
Pt. Returning a call

## 2019-06-02 NOTE — Telephone Encounter (Signed)
Pt. Is returning your call. She says she was in the Dr. Gabriel Carina when you called

## 2019-06-02 NOTE — Telephone Encounter (Signed)
Left message for patient to call office to schedule appt with Dr. Docia Furl tomorrow

## 2019-06-02 NOTE — Telephone Encounter (Signed)
Patient scheduled for new patient appt per Dr. Docia Furl request on 06/08/2019. Records requested by RN to [facility] at Dr. Ebbie Ridge office.

## 2019-06-08 ENCOUNTER — Encounter: Payer: Self-pay | Admitting: Cardiology

## 2019-06-08 ENCOUNTER — Ambulatory Visit (INDEPENDENT_AMBULATORY_CARE_PROVIDER_SITE_OTHER): Payer: Medicare Other | Admitting: Cardiology

## 2019-06-08 ENCOUNTER — Other Ambulatory Visit: Payer: Self-pay

## 2019-06-08 VITALS — BP 154/96 | HR 73 | Ht 62.0 in | Wt 134.2 lb

## 2019-06-08 DIAGNOSIS — E782 Mixed hyperlipidemia: Secondary | ICD-10-CM | POA: Insufficient documentation

## 2019-06-08 DIAGNOSIS — I2510 Atherosclerotic heart disease of native coronary artery without angina pectoris: Secondary | ICD-10-CM | POA: Diagnosis not present

## 2019-06-08 DIAGNOSIS — I209 Angina pectoris, unspecified: Secondary | ICD-10-CM | POA: Insufficient documentation

## 2019-06-08 NOTE — H&P (View-Only) (Signed)
Cardiology Office Note:    Date:  06/08/2019   ID:  [name], DOB 1940-08-19, MRN [id number]  PCP:  Townsend Roger, MD  Cardiologist:  Jenean Lindau, MD   Referring MD: Townsend Roger, MD    ASSESSMENT:    1. Angina pectoris ([place])   2. Coronary artery disease involving native coronary artery of native heart without angina pectoris   3. Mixed dyslipidemia    PLAN:    In order of problems listed above:  1. Angina pectoris: Patient's symptoms are very concerning.  She has very typical angina pectoris.In view of the patient's symptoms, I discussed with the patient options for evaluation. Invasive and noninvasive options were given to the patient. I discussed stress testing and coronary angiography and left heart catheterization at length. Benefits, pros and cons of each approach were discussed at length. Patient had multiple questions which were answered to the patient's satisfaction. Patient opted for invasive evaluation and we will set up for coronary angiography and left heart catheterization. Further recommendations will be made based on the findings with coronary angiography. In the interim if the patient has any significant symptoms in hospital to the nearest emergency room. 2. Sublingual nitroglycerin prescription was sent, its protocol and 911 protocol explained and the patient vocalized understanding questions were answered to the patient's satisfaction 3. I am not sure why she is not on statin therapy but this will be reviewed subsequently and she should be initiated on it. 4. She will be seen in follow-up appointment after the coronary angiography.   Medication Adjustments/Labs and Tests Ordered: Current medicines are reviewed at length with the patient today.  Concerns regarding medicines are outlined above.  No orders of the defined types were placed in this encounter.  No orders of the defined types were placed in this encounter.    Chief Complaint  Patient  presents with  . Chest Pain     History of Present Illness:    [name] is a 78 y.o. female.  Patient has past medical history of nonobstructive coronary artery disease in the remote past.  This patient has been under my care in my previous practice.  He is here now to transfer his care and be established with my current practice.  She saw primary care physician and complained of chest tightness and her doctor called me.  Patient tells me when she exerts herself she has substernal chest tightness radiating to the neck into the arm.  This is very concerning.  She stopped doing what she is doing and feels better.  She has used nitroglycerin in the past with complete relief of the symptoms.  At the time of my evaluation, the patient is alert awake oriented and in no distress.  Past Medical History:  Diagnosis Date  . Arthritis   . Asthma   . Complication of anesthesia    " difficulty breathing with back surgery 2013"  . GERD (gastroesophageal reflux disease)   . Hypertension    DR [name]  [facility] IN [place]  . Shortness of breath    EXERTION    Past Surgical History:  Procedure Laterality Date  . ABDOMINAL HYSTERECTOMY     1996  . BACK SURGERY     2010  . [place] SURGERY     12/2008  . CHOLECYSTECTOMY    . LUMBAR FUSION      Current Medications: Current  Meds  Medication Sig  . acetaminophen (TYLENOL) 650 MG CR tablet Take 1,300 mg by mouth 2 (two) times daily.  Marland Kitchen albuterol (PROVENTIL HFA;VENTOLIN HFA) 108 (90 BASE) MCG/ACT inhaler Inhale 2 puffs into the lungs every 6 (six) hours as needed. For shortness of breath  . aspirin EC 325 MG tablet Take 325 mg by mouth daily.  . cyclobenzaprine (FLEXERIL) 10 MG tablet Take 10 mg by mouth 2 (two) times daily as needed. For muscle spasms  . dicyclomine (BENTYL) 10 MG capsule Take 10 mg by mouth 4 (four) times daily as needed. For stomach spasms  . metoprolol tartrate  (LOPRESSOR) 25 MG tablet Take 25 mg by mouth 2 (two) times daily.  Marland Kitchen omeprazole (PRILOSEC) 20 MG capsule Take 20 mg by mouth 2 (two) times daily.     Allergies:   Patient has no known allergies.   Social History   Socioeconomic History  . Marital status: Married    Spouse name: Not on file  . Number of children: Not on file  . Years of education: Not on file  . Highest education level: Not on file  Occupational History  . Not on file  Social Needs  . Financial resource strain: Not on file  . Food insecurity    Worry: Not on file    Inability: Not on file  . Transportation needs    Medical: Not on file    Non-medical: Not on file  Tobacco Use  . Smoking status: Never Smoker  . Smokeless tobacco: Never Used  Substance and Sexual Activity  . Alcohol use: Yes    Comment: OCC  . Drug use: No  . Sexual activity: Never    Birth control/protection: Post-menopausal  Lifestyle  . Physical activity    Days per week: Not on file    Minutes per session: Not on file  . Stress: Not on file  Relationships  . Social Herbalist on phone: Not on file    Gets together: Not on file    Attends religious service: Not on file    Active member of club or organization: Not on file    Attends meetings of clubs or organizations: Not on file    Relationship status: Not on file  Other Topics Concern  . Not on file  Social History Narrative  . Not on file     Family History: The patient's family history includes Healthy in her mother; Heart attack in her father.  ROS:   Please see the history of present illness.    All other systems reviewed and are negative.  EKGs/Labs/Other Studies Reviewed:    The following studies were reviewed today: EKG reveals sinus rhythm and nonspecific ST-T changes coronary angiography from couple of years ago was reviewed   Recent Labs: No results found for requested labs within last 8760 hours.  Recent Lipid Panel No results found for: CHOL,  TRIG, HDL, CHOLHDL, VLDL, LDLCALC, LDLDIRECT  Physical Exam:    VS:  BP (!) 154/96   Pulse 73   Ht 5\' 2"  (1.575 m)   Wt 134 lb 3.2 oz (60.9 kg)   SpO2 99%   BMI 24.55 kg/m     Wt Readings from Last 3 Encounters:  06/08/19 134 lb 3.2 oz (60.9 kg)  12/12/11 155 lb 6.8 oz (70.5 kg)  12/05/11 155 lb 8 oz (70.5 kg)     GEN: Patient is in no acute distress HEENT: Normal NECK: No JVD; No carotid  bruits LYMPHATICS: No lymphadenopathy CARDIAC: Hear sounds regular, 2/6 systolic murmur at the apex. RESPIRATORY:  Clear to auscultation without rales, wheezing or rhonchi  ABDOMEN: Soft, non-tender, non-distended MUSCULOSKELETAL:  No edema; No deformity  SKIN: Warm and dry NEUROLOGIC:  Alert and oriented x 3 PSYCHIATRIC:  Normal affect   Signed, Jenean Lindau, MD  06/08/2019 11:45 AM    [place]

## 2019-06-08 NOTE — Patient Instructions (Addendum)
Medication Instructions:  Your physician recommends that you continue on your current medications as directed. Please refer to the Current Medication list given to you today.  *If you need a refill on your cardiac medications before your next appointment, please call your pharmacy*  Lab Work: Your physician recommends that you return on 06/21/2019 for CBC and BMP to be drawn  If you have labs (blood work) drawn today and your tests are completely normal, you will receive your results only by: Marland Kitchen MyChart Message (if you have MyChart) OR . A paper copy in the mail If you have any lab test that is abnormal or we need to change your treatment, we will call you to review the results.  Testing/Procedures: You had an EKG performed today  A chest x-ray takes a picture of the organs and structures inside the chest, including the heart, lungs, and blood vessels. This test can show several things, including, whether the heart is enlarges; whether fluid is building up in the lungs; and whether pacemaker / defibrillator leads are still in place.  Your physician has requested that you have a cardiac catheterization. Cardiac catheterization is used to diagnose and/or treat various heart conditions. Doctors may recommend this procedure for a number of different reasons. The most common reason is to evaluate chest pain. Chest pain can be a symptom of coronary artery disease (CAD), and cardiac catheterization can show whether plaque is narrowing or blocking your heart's arteries. This procedure is also used to evaluate the valves, as well as measure the blood flow and oxygen levels in different parts of your heart. For further information please visit [employer]. Please follow instruction sheet, as given.  YOU ARE SCHEDULED FOR CVD19 screening on 06/25/2019 at 2 PM at [place], [place]. Please get in the prescreening line!   [place] [address] Dept: [phone] Loc: [place]  06/08/2019  You are scheduled for a Cardiac Catheterization on Monday, December 14 with Dr. Lauree Chandler.  1. Please arrive at the [facility] (Main Entrance A) at [facility]: [address] [place], [place] [address] at 7:00 AM (This time is two hours before your procedure to ensure your preparation). Free valet parking service is available.   Special note: Every effort is made to have your procedure done on time. Please understand that emergencies sometimes delay scheduled procedures.  2. Diet: Do not eat solid foods after midnight.  The patient may have clear liquids until 5am upon the day of the procedure.  3. Labs: ordered 4. Medication instructions in preparation for your procedure:   Contrast Allergy: No On the morning of your procedure, take your Aspirin and any morning medicines NOT listed above.  You may use sips of water.  5. Plan for one night stay--bring personal belongings. 6. Bring a current list of your medications and current insurance cards. 7. You MUST have a responsible person to drive you home. 8. Someone MUST be with you the first 24 hours after you arrive home or your discharge will be delayed. 9. Please wear clothes that are easy to get on and off and wear slip-on shoes.  Thank you for allowing [place] to care for you!   -- [facility]   Follow-Up: At [facility], you and your health needs are our priority.  As part of our continuing mission to provide you  with exceptional heart care, we have created designated Provider Care Teams.  These Care Teams include your primary Cardiologist (physician) and Advanced Practice Providers (APPs -  Physician Assistants and Nurse Practitioners) who all work together to provide you with the care you need, when you need it.  Your next appointment:   1  month(s)  The format for your next appointment:   In Person  Provider:   Jyl Heinz, MD  Other Instructions  Coronary Angiogram With Stent Coronary angiogram with stent placement is a procedure to widen or open a narrow blood vessel of the heart (coronary artery). Arteries may become blocked by cholesterol buildup (plaques) in the lining of the wall. When a coronary artery becomes partially blocked, blood flow to that area decreases. This may lead to chest pain or a heart attack (myocardial infarction). A stent is a small piece of metal that looks like mesh or a spring. Stent placement may be done as treatment for a heart attack or right after a coronary angiogram in which a blocked artery is found. Let your health care provider know about:  Any allergies you have.  All medicines you are taking, including vitamins, herbs, eye drops, creams, and over-the-counter medicines.  Any problems you or family members have had with anesthetic medicines.  Any blood disorders you have.  Any surgeries you have had.  Any medical conditions you have.  Whether you are pregnant or may be pregnant. What are the risks? Generally, this is a safe procedure. However, problems may occur, including:  Damage to the heart or its blood vessels.  A return of blockage.  Bleeding, infection, or bruising at the insertion site.  A collection of blood under the skin (hematoma) at the insertion site.  A blood clot in another part of the body.  Kidney injury.  Allergic reaction to the dye or contrast that is used.  Bleeding into the abdomen (retroperitoneal bleeding). What happens before the procedure? Staying hydrated Follow instructions from your health care provider about hydration, which may include:  Up to 2 hours before the procedure - you may continue to drink clear liquids, such as water, clear fruit juice, black coffee, and plain tea.  Eating and drinking restrictions Follow  instructions from your health care provider about eating and drinking, which may include:  8 hours before the procedure - stop eating heavy meals or foods such as meat, fried foods, or fatty foods.  6 hours before the procedure - stop eating light meals or foods, such as toast or cereal.  2 hours before the procedure - stop drinking clear liquids. Ask your health care provider about:  Changing or stopping your regular medicines. This is especially important if you are taking diabetes medicines or blood thinners.  Taking medicines such as ibuprofen. These medicines can thin your blood. Do not take these medicines before your procedure if your health care provider instructs you not to. Generally, aspirin is recommended before a procedure of passing a small, thin tube (catheter) through a blood vessel and into the heart (cardiac catheterization). What happens during the procedure?   An IV tube will be inserted into one of your veins.  You will be given one or more of the following: ? A medicine to help you relax (sedative). ? A medicine to numb the area where the catheter will be inserted into an artery (local anesthetic).  To reduce your risk of infection: ? Your health care team will wash or sanitize their hands. ? Your  skin will be washed with soap. ? Hair may be removed from the area where the catheter will be inserted.  Using a guide wire, the catheter will be inserted into an artery. The location may be in your groin, in your wrist, or in the fold of your arm (near your elbow).  A type of X-ray (fluoroscopy) will be used to help guide the catheter to the opening of the arteries in the heart.  A dye will be injected into the catheter, and X-rays will be taken. The dye will help to show where any narrowing or blockages are located in the arteries.  A tiny wire will be guided to the blocked spot, and a balloon will be inflated to make the artery wider.  The stent will be expanded  and will crush the plaques into the wall of the vessel. The stent will hold the area open and improve the blood flow. Most stents have a drug coating to reduce the risk of the stent narrowing over time.  The artery may be made wider using a drill, laser, or other tools to remove plaques.  When the blood flow is better, the catheter will be removed. The lining of the artery will grow over the stent, which stays where it was placed. This procedure may vary among health care providers and hospitals. What happens after the procedure?  If the procedure is done through the leg, you will be kept in bed lying flat for about 6 hours. You will be instructed to not bend and not cross your legs.  The insertion site will be checked frequently.  The pulse in your foot or wrist will be checked frequently.  You may have additional blood tests, X-rays, and a test that records the electrical activity of your heart (electrocardiogram, or ECG). This information is not intended to replace advice given to you by your health care provider. Make sure you discuss any questions you have with your health care provider. Document Released: 01/05/2003 Document Revised: 10/10/2017 Document Reviewed: 02/04/2016 Elsevier Patient Education  2020 [employer].

## 2019-06-08 NOTE — Progress Notes (Signed)
Cardiology Office Note:    Date:  06/08/2019   ID:  [name], DOB 12-20-1940, MRN [id number]  PCP:  Townsend Roger, MD  Cardiologist:  Jenean Lindau, MD   Referring MD: Townsend Roger, MD    ASSESSMENT:    1. Angina pectoris ([place])   2. Coronary artery disease involving native coronary artery of native heart without angina pectoris   3. Mixed dyslipidemia    PLAN:    In order of problems listed above:  1. Angina pectoris: Patient's symptoms are very concerning.  She has very typical angina pectoris.In view of the patient's symptoms, I discussed with the patient options for evaluation. Invasive and noninvasive options were given to the patient. I discussed stress testing and coronary angiography and left heart catheterization at length. Benefits, pros and cons of each approach were discussed at length. Patient had multiple questions which were answered to the patient's satisfaction. Patient opted for invasive evaluation and we will set up for coronary angiography and left heart catheterization. Further recommendations will be made based on the findings with coronary angiography. In the interim if the patient has any significant symptoms in hospital to the nearest emergency room. 2. Sublingual nitroglycerin prescription was sent, its protocol and 911 protocol explained and the patient vocalized understanding questions were answered to the patient's satisfaction 3. I am not sure why she is not on statin therapy but this will be reviewed subsequently and she should be initiated on it. 4. She will be seen in follow-up appointment after the coronary angiography.   Medication Adjustments/Labs and Tests Ordered: Current medicines are reviewed at length with the patient today.  Concerns regarding medicines are outlined above.  No orders of the defined types were placed in this encounter.  No orders of the defined types were placed in this encounter.    Chief Complaint  Patient  presents with  . Chest Pain     History of Present Illness:    [name] is a 78 y.o. female.  Patient has past medical history of nonobstructive coronary artery disease in the remote past.  This patient has been under my care in my previous practice.  He is here now to transfer his care and be established with my current practice.  She saw primary care physician and complained of chest tightness and her doctor called me.  Patient tells me when she exerts herself she has substernal chest tightness radiating to the neck into the arm.  This is very concerning.  She stopped doing what she is doing and feels better.  She has used nitroglycerin in the past with complete relief of the symptoms.  At the time of my evaluation, the patient is alert awake oriented and in no distress.  Past Medical History:  Diagnosis Date  . Arthritis   . Asthma   . Complication of anesthesia    " difficulty breathing with back surgery 2013"  . GERD (gastroesophageal reflux disease)   . Hypertension    DR [name]  [place] [facility] [employer]  . Shortness of breath    EXERTION    Past Surgical History:  Procedure Laterality Date  . ABDOMINAL HYSTERECTOMY     1996  . BACK SURGERY     2010  . [place] SURGERY     12/2008  . CHOLECYSTECTOMY    . LUMBAR FUSION      Current Medications: Current  Meds  Medication Sig  . acetaminophen (TYLENOL) 650 MG CR tablet Take 1,300 mg by mouth 2 (two) times daily.  Marland Kitchen albuterol (PROVENTIL HFA;VENTOLIN HFA) 108 (90 BASE) MCG/ACT inhaler Inhale 2 puffs into the lungs every 6 (six) hours as needed. For shortness of breath  . aspirin EC 325 MG tablet Take 325 mg by mouth daily.  . cyclobenzaprine (FLEXERIL) 10 MG tablet Take 10 mg by mouth 2 (two) times daily as needed. For muscle spasms  . dicyclomine (BENTYL) 10 MG capsule Take 10 mg by mouth 4 (four) times daily as needed. For stomach spasms  . metoprolol tartrate  (LOPRESSOR) 25 MG tablet Take 25 mg by mouth 2 (two) times daily.  Marland Kitchen omeprazole (PRILOSEC) 20 MG capsule Take 20 mg by mouth 2 (two) times daily.     Allergies:   Patient has no known allergies.   Social History   Socioeconomic History  . Marital status: Married    Spouse name: Not on file  . Number of children: Not on file  . Years of education: Not on file  . Highest education level: Not on file  Occupational History  . Not on file  Social Needs  . Financial resource strain: Not on file  . Food insecurity    Worry: Not on file    Inability: Not on file  . Transportation needs    Medical: Not on file    Non-medical: Not on file  Tobacco Use  . Smoking status: Never Smoker  . Smokeless tobacco: Never Used  Substance and Sexual Activity  . Alcohol use: Yes    Comment: OCC  . Drug use: No  . Sexual activity: Never    Birth control/protection: Post-menopausal  Lifestyle  . Physical activity    Days per week: Not on file    Minutes per session: Not on file  . Stress: Not on file  Relationships  . Social Herbalist on phone: Not on file    Gets together: Not on file    Attends religious service: Not on file    Active member of club or organization: Not on file    Attends meetings of clubs or organizations: Not on file    Relationship status: Not on file  Other Topics Concern  . Not on file  Social History Narrative  . Not on file     Family History: The patient's family history includes Healthy in her mother; Heart attack in her father.  ROS:   Please see the history of present illness.    All other systems reviewed and are negative.  EKGs/Labs/Other Studies Reviewed:    The following studies were reviewed today: EKG reveals sinus rhythm and nonspecific ST-T changes coronary angiography from couple of years ago was reviewed   Recent Labs: No results found for requested labs within last 8760 hours.  Recent Lipid Panel No results found for: CHOL,  TRIG, HDL, CHOLHDL, VLDL, LDLCALC, LDLDIRECT  Physical Exam:    VS:  BP (!) 154/96   Pulse 73   Ht 5\' 2"  (1.575 m)   Wt 134 lb 3.2 oz (60.9 kg)   SpO2 99%   BMI 24.55 kg/m     Wt Readings from Last 3 Encounters:  06/08/19 134 lb 3.2 oz (60.9 kg)  12/12/11 155 lb 6.8 oz (70.5 kg)  12/05/11 155 lb 8 oz (70.5 kg)     GEN: Patient is in no acute distress HEENT: Normal NECK: No JVD; No carotid  bruits LYMPHATICS: No lymphadenopathy CARDIAC: Hear sounds regular, 2/6 systolic murmur at the apex. RESPIRATORY:  Clear to auscultation without rales, wheezing or rhonchi  ABDOMEN: Soft, non-tender, non-distended MUSCULOSKELETAL:  No edema; No deformity  SKIN: Warm and dry NEUROLOGIC:  Alert and oriented x 3 PSYCHIATRIC:  Normal affect   Signed, Jenean Lindau, MD  06/08/2019 11:45 AM    [place]

## 2019-06-22 LAB — BASIC METABOLIC PANEL
BUN/Creatinine Ratio: 21 (ref 12–28)
BUN: 17 mg/dL (ref 8–27)
CO2: 25 mmol/L (ref 20–29)
Calcium: 9.5 mg/dL (ref 8.7–10.3)
Chloride: 101 mmol/L (ref 96–106)
Creatinine, Ser: 0.8 mg/dL (ref 0.57–1.00)
GFR calc Af Amer: 83 mL/min/{1.73_m2} (ref >59)
GFR calc non Af Amer: 72 mL/min/{1.73_m2} (ref >59)
Glucose: 81 mg/dL (ref 65–99)
Potassium: 4.6 mmol/L (ref 3.5–5.2)
Sodium: 141 mmol/L (ref 134–144)

## 2019-06-22 LAB — CBC
Hematocrit: 39.4 % (ref 34.0–46.6)
Hemoglobin: 13.3 g/dL (ref 11.1–15.9)
MCH: 31.4 pg (ref 26.6–33.0)
MCHC: 33.8 g/dL (ref 31.5–35.7)
MCV: 93 fL (ref 79–97)
Platelets: 199 10*3/uL (ref 150–450)
RBC: 4.24 x10E6/uL (ref 3.77–5.28)
RDW: 12.6 % (ref 11.7–15.4)
WBC: 5.7 10*3/uL (ref 3.4–10.8)

## 2019-06-24 ENCOUNTER — Telehealth: Payer: Self-pay | Admitting: *Deleted

## 2019-06-24 NOTE — Telephone Encounter (Addendum)
Pt contacted pre-catheterization scheduled at [facility] for: Monday June 28, 2019 9 AM Verified arrival time and place: [facility]) at: 7 AM   No solid food after midnight prior to cath, clear liquids until 5 AM day of procedure. Contrast allergy: no   AM meds can be  taken pre-cath with sip of water including: ASA 81 mg   Confirmed patient has responsible adult to drive home post procedure and observe 24 hours after arriving home: yes  Currently, due to Covid-19 pandemic, only one support person will be allowed with patient. Must be the same support person for that patient's entire stay, will be screened and required to wear a mask. They will be asked to wait in the waiting room for the duration of the patient's stay.  Patients are required to wear a mask when they enter the hospital.      COVID-19 Pre-Screening Questions:  . In the past 7 to 10 days have you had a cough,  shortness of breath, headache, congestion, fever (100 or greater) body aches, chills, sore throat, or sudden loss of taste or sense of smell? no . Have you been around anyone with known Covid 19? no . Have you been around anyone who is awaiting Covid 19 test results in the past 7 to 10 days? no . Have you been around anyone who has been exposed to Covid 19, or has mentioned symptoms of Covid 19 within the past 7 to 10 days? no  I reviewed procedure/mask/visitor instructions, Covid-19 screening questions with patient, she verbalized understanding, thanked me for call.

## 2019-06-25 ENCOUNTER — Other Ambulatory Visit (HOSPITAL_COMMUNITY)
Admission: RE | Admit: 2019-06-25 | Discharge: 2019-06-25 | Disposition: A | Discharge status: Home / Self Care | Payer: Medicare Other | Source: Ambulatory Visit | Attending: Cardiovascular Disease | Admitting: Cardiovascular Disease

## 2019-06-25 DIAGNOSIS — Z20828 Contact with and (suspected) exposure to other viral communicable diseases: Secondary | ICD-10-CM | POA: Diagnosis not present

## 2019-06-25 DIAGNOSIS — Z01812 Encounter for preprocedural laboratory examination: Secondary | ICD-10-CM | POA: Insufficient documentation

## 2019-06-25 LAB — SARS CORONAVIRUS 2 (TAT 6-24 HRS): SARS Coronavirus 2: NEGATIVE

## 2019-06-28 ENCOUNTER — Encounter (HOSPITAL_COMMUNITY)
Admission: RE | Disposition: A | Discharge status: Home / Self Care | Payer: Self-pay | Source: Home / Self Care | Attending: Cardiovascular Disease

## 2019-06-28 ENCOUNTER — Encounter: Payer: Self-pay | Admitting: Cardiology

## 2019-06-28 ENCOUNTER — Ambulatory Visit (HOSPITAL_COMMUNITY)
Admission: RE | Admit: 2019-06-28 | Discharge: 2019-06-28 | Disposition: A | Discharge status: Home / Self Care | Payer: Medicare Other | Attending: Cardiovascular Disease | Admitting: Cardiovascular Disease

## 2019-06-28 ENCOUNTER — Other Ambulatory Visit: Payer: Self-pay

## 2019-06-28 DIAGNOSIS — I25119 Atherosclerotic heart disease of native coronary artery with unspecified angina pectoris: Secondary | ICD-10-CM | POA: Insufficient documentation

## 2019-06-28 DIAGNOSIS — K219 Gastro-esophageal reflux disease without esophagitis: Secondary | ICD-10-CM | POA: Insufficient documentation

## 2019-06-28 DIAGNOSIS — I209 Angina pectoris, unspecified: Secondary | ICD-10-CM

## 2019-06-28 DIAGNOSIS — J45909 Unspecified asthma, uncomplicated: Secondary | ICD-10-CM | POA: Diagnosis not present

## 2019-06-28 DIAGNOSIS — E782 Mixed hyperlipidemia: Secondary | ICD-10-CM | POA: Insufficient documentation

## 2019-06-28 DIAGNOSIS — I10 Essential (primary) hypertension: Secondary | ICD-10-CM | POA: Insufficient documentation

## 2019-06-28 DIAGNOSIS — M1990 Unspecified osteoarthritis, unspecified site: Secondary | ICD-10-CM | POA: Diagnosis not present

## 2019-06-28 DIAGNOSIS — R072 Precordial pain: Secondary | ICD-10-CM

## 2019-06-28 DIAGNOSIS — Z7982 Long term (current) use of aspirin: Secondary | ICD-10-CM | POA: Diagnosis not present

## 2019-06-28 DIAGNOSIS — Z79899 Other long term (current) drug therapy: Secondary | ICD-10-CM | POA: Insufficient documentation

## 2019-06-28 DIAGNOSIS — I2510 Atherosclerotic heart disease of native coronary artery without angina pectoris: Secondary | ICD-10-CM

## 2019-06-28 HISTORY — PX: LEFT HEART CATH AND CORONARY ANGIOGRAPHY: CATH118249

## 2019-06-28 SURGERY — LEFT HEART CATH AND CORONARY ANGIOGRAPHY
Anesthesia: LOCAL

## 2019-06-28 MED ORDER — SODIUM CHLORIDE 0.9% FLUSH: 3.0000 mL | Freq: Two times a day (BID) | INTRAVENOUS | Status: DC

## 2019-06-28 MED ORDER — SODIUM CHLORIDE 0.9 % IV SOLN
INTRAVENOUS | Status: DC
  Administered 2019-06-28: 11:00:00 via INTRAVENOUS

## 2019-06-28 MED ORDER — SODIUM CHLORIDE 0.9 % IV SOLN: 250.0000 mL | INTRAVENOUS | Status: DC | PRN

## 2019-06-28 MED ORDER — SODIUM CHLORIDE 0.9 % WEIGHT BASED INFUSION
3.0000 mL/kg/h | INTRAVENOUS | Status: AC
  Administered 2019-06-28: 3 mL/kg/h via INTRAVENOUS

## 2019-06-28 MED ORDER — LIDOCAINE HCL (PF) 1 % IJ SOLN
INTRAMUSCULAR | Status: DC | PRN
  Administered 2019-06-28: 14 mL

## 2019-06-28 MED ORDER — MIDAZOLAM HCL 2 MG/2ML IJ SOLN
INTRAMUSCULAR | Status: DC | PRN
  Administered 2019-06-28: 1 mg via INTRAVENOUS

## 2019-06-28 MED ORDER — HYDRALAZINE HCL 20 MG/ML IJ SOLN: 10.0000 mg | INTRAMUSCULAR | Status: DC | PRN

## 2019-06-28 MED ORDER — SODIUM CHLORIDE 0.9% FLUSH: 3.0000 mL | INTRAVENOUS | Status: DC | PRN

## 2019-06-28 MED ORDER — MIDAZOLAM HCL 2 MG/2ML IJ SOLN
INTRAMUSCULAR | Status: AC
  Filled 2019-06-28: qty 2

## 2019-06-28 MED ORDER — LABETALOL HCL 5 MG/ML IV SOLN: 10.0000 mg | INTRAVENOUS | Status: DC | PRN

## 2019-06-28 MED ORDER — IOHEXOL 350 MG/ML SOLN
INTRAVENOUS | Status: DC | PRN
  Administered 2019-06-28: 70 mL

## 2019-06-28 MED ORDER — HEPARIN (PORCINE) IN NACL 1000-0.9 UT/500ML-% IV SOLN
INTRAVENOUS | Status: DC | PRN
  Administered 2019-06-28 (×2): 500 mL

## 2019-06-28 MED ORDER — HEPARIN (PORCINE) IN NACL 1000-0.9 UT/500ML-% IV SOLN
INTRAVENOUS | Status: AC
  Filled 2019-06-28: qty 1000

## 2019-06-28 MED ORDER — ACETAMINOPHEN 325 MG PO TABS: 650.0000 mg | ORAL_TABLET | ORAL | Status: DC | PRN

## 2019-06-28 MED ORDER — SODIUM CHLORIDE 0.9 % WEIGHT BASED INFUSION: 1.0000 mL/kg/h | INTRAVENOUS | Status: DC

## 2019-06-28 MED ORDER — LIDOCAINE HCL (PF) 1 % IJ SOLN
INTRAMUSCULAR | Status: AC
  Filled 2019-06-28: qty 30

## 2019-06-28 MED ORDER — FENTANYL CITRATE (PF) 100 MCG/2ML IJ SOLN
INTRAMUSCULAR | Status: DC | PRN
  Administered 2019-06-28: 25 ug via INTRAVENOUS

## 2019-06-28 MED ORDER — ONDANSETRON HCL 4 MG/2ML IJ SOLN: 4.0000 mg | Freq: Four times a day (QID) | INTRAMUSCULAR | Status: DC | PRN

## 2019-06-28 MED ORDER — FENTANYL CITRATE (PF) 100 MCG/2ML IJ SOLN
INTRAMUSCULAR | Status: AC
  Filled 2019-06-28: qty 2

## 2019-06-28 MED ORDER — ASPIRIN 81 MG PO CHEW: 81.0000 mg | CHEWABLE_TABLET | ORAL | Status: DC

## 2019-06-28 SURGICAL SUPPLY — 9 items
CATH DXT MULTI JL4 JR4 ANG PIG (CATHETERS) ×2 IMPLANT
CLOSURE MYNX CONTROL 5F (Vascular Products) ×2 IMPLANT
KIT HEART LEFT (KITS) ×2 IMPLANT
PACK CARDIAC CATHETERIZATION (CUSTOM PROCEDURE TRAY) ×2 IMPLANT
SHEATH PINNACLE 5F 10CM (SHEATH) ×2 IMPLANT
SYR MEDRAD MARK 7 150ML (SYRINGE) ×2 IMPLANT
TRANSDUCER W/STOPCOCK (MISCELLANEOUS) ×2 IMPLANT
TUBING CIL FLEX 10 FLL-RA (TUBING) ×2 IMPLANT
WIRE EMERALD 3MM-J .035X150CM (WIRE) ×2 IMPLANT

## 2019-06-28 NOTE — Interval H&P Note (Signed)
History and Physical Interval Note:  06/28/2019 7:40 AM  [name]  has presented today for surgery, with the diagnosis of unstable angina.  The various methods of treatment have been discussed with the patient and family. After consideration of risks, benefits and other options for treatment, the patient has consented to  Procedure(s): LEFT HEART CATH AND CORONARY ANGIOGRAPHY (N/A) as a surgical intervention.  The patient's history has been reviewed, patient examined, no change in status, stable for surgery.  I have reviewed the patient's chart and labs.  Questions were answered to the patient's satisfaction.    Cath Lab Visit (complete for each Cath Lab visit)  Clinical Evaluation Leading to the Procedure:   ACS: No.  Non-ACS:    Anginal Classification: CCS III  Anti-ischemic medical therapy: Minimal Therapy (1 class of medications)  Non-Invasive Test Results: No non-invasive testing performed  Prior CABG: No previous CABG         [name]

## 2019-06-28 NOTE — Discharge Instructions (Signed)
Femoral Site Care °This sheet gives you information about how to care for yourself after your procedure. Your health care provider may also give you more specific instructions. If you have problems or questions, contact your health care provider. °What can I expect after the procedure? °After the procedure, it is common to have: °· Bruising that usually fades within 1-2 weeks. °· Tenderness at the site. °Follow these instructions at home: °Wound care °· Follow instructions from your health care provider about how to take care of your insertion site. Make sure you: °? Wash your hands with soap and water before you change your bandage (dressing). If soap and water are not available, use hand sanitizer. °? Change your dressing as told by your health care provider. °? Leave stitches (sutures), skin glue, or adhesive strips in place. These skin closures may need to stay in place for 2 weeks or longer. If adhesive strip edges start to loosen and curl up, you may trim the loose edges. Do not remove adhesive strips completely unless your health care provider tells you to do that. °· Do not take baths, swim, or use a hot tub until your health care provider approves. °· You may shower 24-48 hours after the procedure or as told by your health care provider. °? Gently wash the site with plain soap and water. °? Pat the area dry with a clean towel. °? Do not rub the site. This may cause bleeding. °· Do not apply powder or lotion to the site. Keep the site clean and dry. °· Check your femoral site every day for signs of infection. Check for: °? Redness, swelling, or pain. °? Fluid or blood. °? Warmth. °? Pus or a bad smell. °Activity °· For the first 2-3 days after your procedure, or as long as directed: °? Avoid climbing stairs as much as possible. °? Do not squat. °· Do not lift anything that is heavier than 10 lb (4.5 kg), or the limit that you are told, until your health care provider says that it is safe. °· Rest as  directed. °? Avoid sitting for a long time without moving. Get up to take short walks every 1-2 hours. °· Do not drive for 24 hours if you were given a medicine to help you relax (sedative). °General instructions °· Take over-the-counter and prescription medicines only as told by your health care provider. °· Keep all follow-up visits as told by your health care provider. This is important. °Contact a health care provider if you have: °· A fever or chills. °· You have redness, swelling, or pain around your insertion site. °Get help right away if: °· The catheter insertion area swells very fast. °· You pass out. °· You suddenly start to sweat or your skin gets clammy. °· The catheter insertion area is bleeding, and the bleeding does not stop when you hold steady pressure on the area. °· The area near or just beyond the catheter insertion site becomes pale, cool, tingly, or numb. °These symptoms may represent a serious problem that is an emergency. Do not wait to see if the symptoms will go away. Get medical help right away. Call your local emergency services (911 in the [place] Do not drive yourself to the hospital. °Summary °· After the procedure, it is common to have bruising that usually fades within 1-2 weeks. °· Check your femoral site every day for signs of infection. °· Do not lift anything that is heavier than 10 lb (4.5 kg), or the   limit that you are told, until your health care provider says that it is safe. °This information is not intended to replace advice given to you by your health care provider. Make sure you discuss any questions you have with your health care provider. °Document Released: 03/04/2014 Document Revised: 07/14/2017 Document Reviewed: 07/14/2017 °Elsevier Patient Education © 2020 [employer]vier Inc[employer] ° °

## 2019-07-06 ENCOUNTER — Encounter: Payer: Self-pay | Admitting: *Deleted

## 2019-07-30 ENCOUNTER — Encounter: Payer: Self-pay | Admitting: Cardiology

## 2019-07-30 ENCOUNTER — Ambulatory Visit (INDEPENDENT_AMBULATORY_CARE_PROVIDER_SITE_OTHER): Payer: PPO | Admitting: Cardiology

## 2019-07-30 ENCOUNTER — Other Ambulatory Visit: Payer: Self-pay

## 2019-07-30 VITALS — BP 118/64 | HR 66 | Ht 62.0 in | Wt 134.4 lb

## 2019-07-30 DIAGNOSIS — I2510 Atherosclerotic heart disease of native coronary artery without angina pectoris: Secondary | ICD-10-CM

## 2019-07-30 NOTE — Patient Instructions (Signed)
Medication Instructions:  Your physician recommends that you continue on your current medications as directed. Please refer to the Current Medication list given to you today.  *If you need a refill on your cardiac medications before your next appointment, please call your pharmacy*  Lab Work: Your physician recommends that you return for lab work in: TODAY BMP  If you have labs (blood work) drawn today and your tests are completely normal, you will receive your results only by: Marland Kitchen MyChart Message (if you have MyChart) OR . A paper copy in the mail If you have any lab test that is abnormal or we need to change your treatment, we will call you to review the results.  Testing/Procedures: NOne  Follow-Up: At [facility], you and your health needs are our priority.  As part of our continuing mission to provide you with exceptional heart care, we have created designated Provider Care Teams.  These Care Teams include your primary Cardiologist (physician) and Advanced Practice Providers (APPs -  Physician Assistants and Nurse Practitioners) who all work together to provide you with the care you need, when you need it.  Your next appointment:   6 month(s)  The format for your next appointment:   In Person  Provider:   Jyl Heinz, MD  Other Instructions

## 2019-07-30 NOTE — Progress Notes (Signed)
Cardiology Office Note:    Date:  07/30/2019   ID:  [name], DOB [birth year], MRN [id number]  PCP:  Townsend Roger, MD  Cardiologist:  Jenean Lindau, MD   Referring MD: Townsend Roger, MD    ASSESSMENT:    No diagnosis found. PLAN:    In order of problems listed above:  1. Secondary prevention stressed with the patient.  Importance of compliance with diet and medication stressed and she vocalized understanding. 2. Essential hypertension: Blood pressure stable.  She is taking diuretic and therefore potassium supplementation and diet was emphasized.  She will have a Chem-7 today. 3. Mixed dyslipidemia: Lipids followed by primary care physician and goal LDL must be less than 100 4. Patient will be seen in follow-up appointment in 6 months or earlier if the patient has any concerns    Medication Adjustments/Labs and Tests Ordered: Current medicines are reviewed at length with the patient today.  Concerns regarding medicines are outlined above.  No orders of the defined types were placed in this encounter.  No orders of the defined types were placed in this encounter.    No chief complaint on file.    History of Present Illness:    [name] is a 79 y.o. female.  Patient was evaluated by me for chest pain suggesting of angina.  Fortunately coronary angiography revealed only nonobstructive disease.  She is doing fine.  She is using a bicycle 45 minutes a day without any problems.  At the time of my evaluation, the patient is alert awake oriented and in no distress.  Past Medical History:  Diagnosis Date  . Arthritis   . Asthma   . Complication of anesthesia    " difficulty breathing with back surgery 2013"  . GERD (gastroesophageal reflux disease)   . Hypertension    DR [name]  [place] [facility] IN [place]  . Shortness of breath    EXERTION    Past Surgical History:  Procedure Laterality Date  . ABDOMINAL HYSTERECTOMY     1996  . BACK SURGERY     2010    . [place] SURGERY     12/2008  . CHOLECYSTECTOMY    . LEFT HEART CATH AND CORONARY ANGIOGRAPHY N/A 06/28/2019   Procedure: LEFT HEART CATH AND CORONARY ANGIOGRAPHY;  Surgeon: Burnell Blanks, MD;  Location: [facility];  Service: Cardiovascular;  Laterality: N/A;  . LUMBAR FUSION      Current Medications: Current Meds  Medication Sig  . acetaminophen (TYLENOL) 650 MG CR tablet Take 1,300 mg by mouth 2 (two) times daily.  Marland Kitchen albuterol (PROVENTIL HFA;VENTOLIN HFA) 108 (90 BASE) MCG/ACT inhaler Inhale 2 puffs into the lungs every 6 (six) hours as needed. For shortness of breath  . aspirin EC 81 MG tablet Take 81 mg by mouth daily.   . chlorthalidone (HYGROTON) 25 MG tablet Take 25 mg by mouth daily.  . cyclobenzaprine (FLEXERIL) 10 MG tablet Take 10 mg by mouth 2 (two) times daily as needed. For muscle spasms  . dicyclomine (BENTYL) 10 MG capsule Take 10 mg by mouth 2 (two) times daily as needed for spasms. For stomach spasms  . metoprolol succinate (TOPROL-XL) 100 MG 24 hr tablet Take 100 mg by mouth daily. Take with or immediately following a meal.  . omeprazole (PRILOSEC) 20 MG capsule Take 20 mg by mouth 2 (two) times daily.  . [  DISCONTINUED] metoprolol tartrate (LOPRESSOR) 25 MG tablet Take 100 mg by mouth daily.      Allergies:   Patient has no known allergies.   Social History   Socioeconomic History  . Marital status: Married    Spouse name: Not on file  . Number of children: Not on file  . Years of education: Not on file  . Highest education level: Not on file  Occupational History  . Not on file  Tobacco Use  . Smoking status: Never Smoker  . Smokeless tobacco: Never Used  Substance and Sexual Activity  . Alcohol use: Yes    Comment: OCC  . Drug use: No  . Sexual activity: Never    Birth control/protection: Post-menopausal  Other Topics Concern  . Not on file  Social History Narrative  . Not on  file   Social Determinants of Health   Financial Resource Strain:   . Difficulty of Paying Living Expenses: Not on file  Food Insecurity:   . Worried About Charity fundraiser in the Last Year: Not on file  . Ran Out of Food in the Last Year: Not on file  Transportation Needs:   . Lack of Transportation (Medical): Not on file  . Lack of Transportation (Non-Medical): Not on file  Physical Activity:   . Days of Exercise per Week: Not on file  . Minutes of Exercise per Session: Not on file  Stress:   . Feeling of Stress : Not on file  Social Connections:   . Frequency of Communication with Friends and Family: Not on file  . Frequency of Social Gatherings with Friends and Family: Not on file  . Attends Religious Services: Not on file  . Active Member of Clubs or Organizations: Not on file  . Attends Archivist Meetings: Not on file  . Marital Status: Not on file     Family History: The patient's family history includes Healthy in her mother; Heart attack in her father.  ROS:   Please see the history of present illness.    All other systems reviewed and are negative.  EKGs/Labs/Other Studies Reviewed:    The following studies were reviewed today: LEFT HEART CATH AND CORONARY ANGIOGRAPHY  Conclusion    Ost LAD to Prox LAD lesion is 10% stenosed.  Dist LAD lesion is 10% stenosed.  The left ventricular systolic function is normal.  LV end diastolic pressure is normal.  The left ventricular ejection fraction is greater than 65% by visual estimate.  There is no mitral valve regurgitation.   1. Mild non-obstructive CAD 2. Normal LV systolic function  Recommendations: Medical management of mild CAD      Recent Labs: 06/21/2019: BUN 17; Creatinine, Ser 0.80; Hemoglobin 13.3; Platelets 199; Potassium 4.6; Sodium 141  Recent Lipid Panel No results found for: CHOL, TRIG, HDL, CHOLHDL, VLDL, LDLCALC, LDLDIRECT  Physical Exam:    VS:  BP 118/64   Pulse 66    Ht 5\' 2"  (1.575 m)   Wt 134 lb 6.4 oz (61 kg)   SpO2 100%   BMI 24.58 kg/m     Wt Readings from Last 3 Encounters:  07/30/19 134 lb 6.4 oz (61 kg)  06/28/19 132 lb (59.9 kg)  06/08/19 134 lb 3.2 oz (60.9 kg)     GEN: Patient is in no acute distress HEENT: Normal NECK: No JVD; No carotid bruits LYMPHATICS: No lymphadenopathy CARDIAC: Hear sounds regular, 2/6 systolic murmur at the apex. RESPIRATORY:  Clear to auscultation  without rales, wheezing or rhonchi  ABDOMEN: Soft, non-tender, non-distended MUSCULOSKELETAL:  No edema; No deformity  SKIN: Warm and dry NEUROLOGIC:  Alert and oriented x 3 PSYCHIATRIC:  Normal affect   Signed, Jenean Lindau, MD  07/30/2019 11:52 AM    [place]

## 2019-07-31 LAB — BASIC METABOLIC PANEL
BUN/Creatinine Ratio: 24 (ref 12–28)
BUN: 22 mg/dL (ref 8–27)
CO2: 27 mmol/L (ref 20–29)
Calcium: 9.6 mg/dL (ref 8.7–10.3)
Chloride: 99 mmol/L (ref 96–106)
Creatinine, Ser: 0.9 mg/dL (ref 0.57–1.00)
GFR calc Af Amer: 72 mL/min/{1.73_m2} (ref >59)
GFR calc non Af Amer: 62 mL/min/{1.73_m2} (ref >59)
Glucose: 78 mg/dL (ref 65–99)
Potassium: 4.3 mmol/L (ref 3.5–5.2)
Sodium: 139 mmol/L (ref 134–144)

## 2019-08-04 ENCOUNTER — Telehealth: Payer: Self-pay

## 2019-08-04 NOTE — Telephone Encounter (Signed)
Left message for patient to call office for results, copy sent to Dr. Nona Dell

## 2019-08-04 NOTE — Telephone Encounter (Signed)
-----   Message from Jenean Lindau, MD sent at 08/02/2019  9:15 AM EST ----- The results of the study is unremarkable. Please inform patient. I will discuss in detail at next appointment. Cc  primary care/referring physician Jenean Lindau, MD 08/02/2019 9:15 AM

## 2019-08-04 NOTE — Telephone Encounter (Signed)
I spoke with patient and reviewed recent BMP results with her

## 2019-08-04 NOTE — Telephone Encounter (Signed)
Patient returning call.

## 2019-09-06 DIAGNOSIS — K589 Irritable bowel syndrome without diarrhea: Secondary | ICD-10-CM | POA: Diagnosis not present

## 2019-09-06 DIAGNOSIS — E7800 Pure hypercholesterolemia, unspecified: Secondary | ICD-10-CM | POA: Diagnosis not present

## 2019-09-06 DIAGNOSIS — Z1389 Encounter for screening for other disorder: Secondary | ICD-10-CM | POA: Diagnosis not present

## 2019-09-06 DIAGNOSIS — M8949 Other hypertrophic osteoarthropathy, multiple sites: Secondary | ICD-10-CM | POA: Diagnosis not present

## 2019-09-06 DIAGNOSIS — Z0000 Encounter for general adult medical examination without abnormal findings: Secondary | ICD-10-CM | POA: Diagnosis not present

## 2019-09-06 DIAGNOSIS — J4540 Moderate persistent asthma, uncomplicated: Secondary | ICD-10-CM | POA: Diagnosis not present

## 2019-09-06 DIAGNOSIS — I10 Essential (primary) hypertension: Secondary | ICD-10-CM | POA: Diagnosis not present

## 2019-11-15 DIAGNOSIS — L821 Other seborrheic keratosis: Secondary | ICD-10-CM | POA: Diagnosis not present

## 2019-11-15 DIAGNOSIS — L578 Other skin changes due to chronic exposure to nonionizing radiation: Secondary | ICD-10-CM | POA: Diagnosis not present

## 2019-11-15 DIAGNOSIS — L820 Inflamed seborrheic keratosis: Secondary | ICD-10-CM | POA: Diagnosis not present

## 2019-11-15 DIAGNOSIS — L570 Actinic keratosis: Secondary | ICD-10-CM | POA: Diagnosis not present

## 2019-12-06 DIAGNOSIS — E7800 Pure hypercholesterolemia, unspecified: Secondary | ICD-10-CM | POA: Diagnosis not present

## 2019-12-06 DIAGNOSIS — G4700 Insomnia, unspecified: Secondary | ICD-10-CM | POA: Diagnosis not present

## 2019-12-06 DIAGNOSIS — I10 Essential (primary) hypertension: Secondary | ICD-10-CM | POA: Diagnosis not present

## 2020-01-28 DIAGNOSIS — H26491 Other secondary cataract, right eye: Secondary | ICD-10-CM | POA: Diagnosis not present

## 2020-02-06 DIAGNOSIS — S0101XA Laceration without foreign body of scalp, initial encounter: Secondary | ICD-10-CM | POA: Diagnosis not present

## 2020-03-08 DIAGNOSIS — I10 Essential (primary) hypertension: Secondary | ICD-10-CM | POA: Diagnosis not present

## 2020-03-21 DIAGNOSIS — Z1152 Encounter for screening for COVID-19: Secondary | ICD-10-CM | POA: Diagnosis not present

## 2020-03-21 DIAGNOSIS — R519 Headache, unspecified: Secondary | ICD-10-CM | POA: Diagnosis not present

## 2020-04-19 DIAGNOSIS — Z23 Encounter for immunization: Secondary | ICD-10-CM | POA: Diagnosis not present

## 2020-05-16 DIAGNOSIS — L821 Other seborrheic keratosis: Secondary | ICD-10-CM | POA: Diagnosis not present

## 2020-05-16 DIAGNOSIS — L570 Actinic keratosis: Secondary | ICD-10-CM | POA: Diagnosis not present

## 2020-05-16 DIAGNOSIS — L578 Other skin changes due to chronic exposure to nonionizing radiation: Secondary | ICD-10-CM | POA: Diagnosis not present

## 2020-06-16 DIAGNOSIS — K589 Irritable bowel syndrome without diarrhea: Secondary | ICD-10-CM | POA: Diagnosis not present

## 2020-06-16 DIAGNOSIS — I10 Essential (primary) hypertension: Secondary | ICD-10-CM | POA: Diagnosis not present

## 2020-08-03 DIAGNOSIS — K219 Gastro-esophageal reflux disease without esophagitis: Secondary | ICD-10-CM | POA: Diagnosis not present

## 2020-08-03 DIAGNOSIS — K591 Functional diarrhea: Secondary | ICD-10-CM | POA: Diagnosis not present

## 2020-09-14 DIAGNOSIS — K591 Functional diarrhea: Secondary | ICD-10-CM | POA: Diagnosis not present

## 2020-09-14 DIAGNOSIS — K219 Gastro-esophageal reflux disease without esophagitis: Secondary | ICD-10-CM | POA: Diagnosis not present

## 2020-09-18 DIAGNOSIS — Z1339 Encounter for screening examination for other mental health and behavioral disorders: Secondary | ICD-10-CM | POA: Diagnosis not present

## 2020-09-18 DIAGNOSIS — Z6824 Body mass index (BMI) 24.0-24.9, adult: Secondary | ICD-10-CM | POA: Diagnosis not present

## 2020-09-18 DIAGNOSIS — Z1331 Encounter for screening for depression: Secondary | ICD-10-CM | POA: Diagnosis not present

## 2020-09-18 DIAGNOSIS — Z0000 Encounter for general adult medical examination without abnormal findings: Secondary | ICD-10-CM | POA: Diagnosis not present

## 2020-09-18 DIAGNOSIS — E7800 Pure hypercholesterolemia, unspecified: Secondary | ICD-10-CM | POA: Diagnosis not present

## 2020-09-18 DIAGNOSIS — I10 Essential (primary) hypertension: Secondary | ICD-10-CM | POA: Diagnosis not present

## 2020-09-20 DIAGNOSIS — G4700 Insomnia, unspecified: Secondary | ICD-10-CM | POA: Diagnosis not present

## 2020-09-20 DIAGNOSIS — E7800 Pure hypercholesterolemia, unspecified: Secondary | ICD-10-CM | POA: Diagnosis not present

## 2020-09-20 DIAGNOSIS — E042 Nontoxic multinodular goiter: Secondary | ICD-10-CM | POA: Diagnosis not present

## 2020-09-20 DIAGNOSIS — I10 Essential (primary) hypertension: Secondary | ICD-10-CM | POA: Diagnosis not present

## 2020-09-20 DIAGNOSIS — Z6824 Body mass index (BMI) 24.0-24.9, adult: Secondary | ICD-10-CM | POA: Diagnosis not present

## 2020-09-26 DIAGNOSIS — L570 Actinic keratosis: Secondary | ICD-10-CM | POA: Diagnosis not present

## 2020-10-16 DIAGNOSIS — K5730 Diverticulosis of large intestine without perforation or abscess without bleeding: Secondary | ICD-10-CM | POA: Diagnosis not present

## 2020-10-16 DIAGNOSIS — K591 Functional diarrhea: Secondary | ICD-10-CM | POA: Diagnosis not present

## 2020-11-16 DIAGNOSIS — L570 Actinic keratosis: Secondary | ICD-10-CM | POA: Diagnosis not present

## 2020-12-20 DIAGNOSIS — K589 Irritable bowel syndrome without diarrhea: Secondary | ICD-10-CM | POA: Diagnosis not present

## 2020-12-20 DIAGNOSIS — I10 Essential (primary) hypertension: Secondary | ICD-10-CM | POA: Diagnosis not present

## 2021-03-14 DIAGNOSIS — L570 Actinic keratosis: Secondary | ICD-10-CM | POA: Diagnosis not present

## 2021-03-14 DIAGNOSIS — L821 Other seborrheic keratosis: Secondary | ICD-10-CM | POA: Diagnosis not present

## 2021-03-23 DIAGNOSIS — Z23 Encounter for immunization: Secondary | ICD-10-CM | POA: Diagnosis not present

## 2021-03-23 DIAGNOSIS — I10 Essential (primary) hypertension: Secondary | ICD-10-CM | POA: Diagnosis not present

## 2021-05-17 DIAGNOSIS — C44519 Basal cell carcinoma of skin of other part of trunk: Secondary | ICD-10-CM | POA: Diagnosis not present

## 2021-06-21 DIAGNOSIS — C44529 Squamous cell carcinoma of skin of other part of trunk: Secondary | ICD-10-CM | POA: Diagnosis not present

## 2021-07-02 DIAGNOSIS — I10 Essential (primary) hypertension: Secondary | ICD-10-CM | POA: Diagnosis not present

## 2021-07-23 DIAGNOSIS — R350 Frequency of micturition: Secondary | ICD-10-CM | POA: Diagnosis not present

## 2021-08-29 DIAGNOSIS — N3281 Overactive bladder: Secondary | ICD-10-CM | POA: Diagnosis not present

## 2021-10-01 DIAGNOSIS — W19XXXA Unspecified fall, initial encounter: Secondary | ICD-10-CM | POA: Diagnosis not present

## 2021-10-01 DIAGNOSIS — R410 Disorientation, unspecified: Secondary | ICD-10-CM | POA: Diagnosis not present

## 2021-10-01 DIAGNOSIS — R413 Other amnesia: Secondary | ICD-10-CM | POA: Diagnosis not present

## 2021-10-01 DIAGNOSIS — I10 Essential (primary) hypertension: Secondary | ICD-10-CM | POA: Diagnosis not present

## 2021-10-01 DIAGNOSIS — N3281 Overactive bladder: Secondary | ICD-10-CM | POA: Diagnosis not present

## 2021-10-09 DIAGNOSIS — L821 Other seborrheic keratosis: Secondary | ICD-10-CM | POA: Diagnosis not present

## 2021-10-09 DIAGNOSIS — L720 Epidermal cyst: Secondary | ICD-10-CM | POA: Diagnosis not present

## 2021-10-09 DIAGNOSIS — R413 Other amnesia: Secondary | ICD-10-CM | POA: Diagnosis not present

## 2021-10-15 DIAGNOSIS — R519 Headache, unspecified: Secondary | ICD-10-CM | POA: Diagnosis not present

## 2021-10-15 DIAGNOSIS — G939 Disorder of brain, unspecified: Secondary | ICD-10-CM | POA: Diagnosis not present

## 2021-10-31 DIAGNOSIS — H6980 Other specified disorders of Eustachian tube, unspecified ear: Secondary | ICD-10-CM | POA: Diagnosis not present

## 2021-10-31 DIAGNOSIS — J069 Acute upper respiratory infection, unspecified: Secondary | ICD-10-CM | POA: Diagnosis not present

## 2022-01-03 DIAGNOSIS — D329 Benign neoplasm of meninges, unspecified: Secondary | ICD-10-CM | POA: Diagnosis not present

## 2022-01-03 DIAGNOSIS — K219 Gastro-esophageal reflux disease without esophagitis: Secondary | ICD-10-CM | POA: Diagnosis not present

## 2022-01-03 DIAGNOSIS — K589 Irritable bowel syndrome without diarrhea: Secondary | ICD-10-CM | POA: Diagnosis not present

## 2022-01-03 DIAGNOSIS — Z6826 Body mass index (BMI) 26.0-26.9, adult: Secondary | ICD-10-CM | POA: Diagnosis not present

## 2022-01-03 DIAGNOSIS — N1831 Chronic kidney disease, stage 3a: Secondary | ICD-10-CM | POA: Diagnosis not present

## 2022-01-03 DIAGNOSIS — Z1331 Encounter for screening for depression: Secondary | ICD-10-CM | POA: Diagnosis not present

## 2022-01-03 DIAGNOSIS — Z0000 Encounter for general adult medical examination without abnormal findings: Secondary | ICD-10-CM | POA: Diagnosis not present

## 2022-01-03 DIAGNOSIS — N3281 Overactive bladder: Secondary | ICD-10-CM | POA: Diagnosis not present

## 2022-01-03 DIAGNOSIS — I10 Essential (primary) hypertension: Secondary | ICD-10-CM | POA: Diagnosis not present

## 2022-01-03 DIAGNOSIS — E7800 Pure hypercholesterolemia, unspecified: Secondary | ICD-10-CM | POA: Diagnosis not present

## 2022-02-12 DIAGNOSIS — Z1231 Encounter for screening mammogram for malignant neoplasm of breast: Secondary | ICD-10-CM | POA: Diagnosis not present

## 2022-04-05 DIAGNOSIS — Z23 Encounter for immunization: Secondary | ICD-10-CM | POA: Diagnosis not present

## 2022-04-05 DIAGNOSIS — I10 Essential (primary) hypertension: Secondary | ICD-10-CM | POA: Diagnosis not present

## 2022-04-05 DIAGNOSIS — N3281 Overactive bladder: Secondary | ICD-10-CM | POA: Diagnosis not present

## 2022-05-23 DIAGNOSIS — L578 Other skin changes due to chronic exposure to nonionizing radiation: Secondary | ICD-10-CM | POA: Diagnosis not present

## 2022-05-23 DIAGNOSIS — L570 Actinic keratosis: Secondary | ICD-10-CM | POA: Diagnosis not present

## 2022-05-23 DIAGNOSIS — L821 Other seborrheic keratosis: Secondary | ICD-10-CM | POA: Diagnosis not present

## 2022-05-27 DIAGNOSIS — H5203 Hypermetropia, bilateral: Secondary | ICD-10-CM | POA: Diagnosis not present

## 2022-05-27 DIAGNOSIS — H353131 Nonexudative age-related macular degeneration, bilateral, early dry stage: Secondary | ICD-10-CM | POA: Diagnosis not present

## 2022-05-27 DIAGNOSIS — H52223 Regular astigmatism, bilateral: Secondary | ICD-10-CM | POA: Diagnosis not present

## 2022-05-27 DIAGNOSIS — Z9841 Cataract extraction status, right eye: Secondary | ICD-10-CM | POA: Diagnosis not present

## 2022-05-27 DIAGNOSIS — Z9842 Cataract extraction status, left eye: Secondary | ICD-10-CM | POA: Diagnosis not present

## 2022-05-27 DIAGNOSIS — Z961 Presence of intraocular lens: Secondary | ICD-10-CM | POA: Diagnosis not present

## 2022-05-27 DIAGNOSIS — H524 Presbyopia: Secondary | ICD-10-CM | POA: Diagnosis not present

## 2022-06-20 ENCOUNTER — Other Ambulatory Visit: Payer: Self-pay

## 2022-06-20 MED ORDER — CHOLESTYRAMINE 4 GM/DOSE PO POWD: 4.0000 g | Freq: Every day | ORAL | 0 refills | Status: DC

## 2022-08-06 ENCOUNTER — Ambulatory Visit: Payer: PPO | Admitting: Internal Medicine

## 2022-08-07 ENCOUNTER — Ambulatory Visit: Payer: PPO | Admitting: Internal Medicine

## 2022-08-07 ENCOUNTER — Encounter: Payer: Self-pay | Admitting: Internal Medicine

## 2022-08-07 VITALS — BP 118/74 | HR 69 | Temp 97.7°F | Resp 18 | Ht 62.0 in | Wt 149.4 lb

## 2022-08-07 DIAGNOSIS — I10 Essential (primary) hypertension: Secondary | ICD-10-CM

## 2022-08-07 DIAGNOSIS — N3281 Overactive bladder: Secondary | ICD-10-CM | POA: Insufficient documentation

## 2022-08-07 DIAGNOSIS — N1831 Chronic kidney disease, stage 3a: Secondary | ICD-10-CM

## 2022-08-07 MED ORDER — LOSARTAN POTASSIUM 50 MG PO TABS: 75.0000 mg | ORAL_TABLET | Freq: Every day | ORAL | 3 refills | Status: DC

## 2022-08-07 MED ORDER — CHOLESTYRAMINE 4 GM/DOSE PO POWD: 4.0000 g | Freq: Every day | ORAL | 0 refills | Status: DC

## 2022-08-07 MED ORDER — METOPROLOL TARTRATE 100 MG PO TABS: 100.0000 mg | ORAL_TABLET | Freq: Every day | ORAL | 3 refills | Status: DC

## 2022-08-07 NOTE — Assessment & Plan Note (Signed)
I want her to avoid NSAIDS.  She has arthritis that effects her hands/fingers and she takes tylenol '1000mg'$  BID.  I do not want her on NSAIDS or celebrex due to her her history of CKD and CAD.

## 2022-08-07 NOTE — Assessment & Plan Note (Signed)
She wants to hold off on meds and she does not want to see a urologist yet.

## 2022-08-07 NOTE — Assessment & Plan Note (Signed)
Her BP is doing well today.  Continue on her current medications.

## 2022-08-07 NOTE — Progress Notes (Signed)
Office Visit  Subjective   Patient ID: [name]   DOB: 1940/09/06   Age: 82 y.o.   MRN: [id number]   Chief Complaint Chief Complaint  Patient presents with   Follow-up    HTN     History of Present Illness [name] returns for followup of overactive bladder with urinary frequency.   I wanted to start her on myrbetriq but she is not interested in taking a medication.   I also wanted to refer her to urology but she does not want to do this yet.  She has tried a trial of gemtesa but she states this did not help.  There is no f/c, n/v, low back pain, abdominal pain, dysuria, hematuria or other problems.  She does states she get up about 4 times at night to urinate.  However during the day she may urinate 17 times.  She states she does not feel like she empties her bladder where she will urinate, stop and then go again.  She denies any leakage with she coughs or sneezes.     The patient is a 82 year old Caucasian/White female who presents for a follow-up of her hypertension.   The patient has been not checking her blood pressure at home. She is currently on losartan '75mg'$  daily and metoprolol tartrate '100mg'$  daily.  The patient denies any visual changes, dizziness, lightheadness, chest pain, shortness of breath, and edema. She reports there have been no other symptoms noted.  The patient also has a history of Stage IIIa CKD where we have noted there has been no change to her kidney function over the last several years.         Past Medical History Past Medical History:  Diagnosis Date   Arthritis    Asthma    Cervical spinal stenosis    CKD (chronic kidney disease), stage III ([place])    Complication of anesthesia    " difficulty breathing with back surgery 2013"   GERD (gastroesophageal reflux disease)    Hypercholesterolemia    Hypertension    DR [name]  [place] CARD IN [place]   IBS (irritable bowel syndrome)    Insomnia    Lumbar stenosis    Meningioma ([facility])     Multinodular goiter    Overactive bladder    Statin intolerance      Allergies Allergies  Allergen Reactions   Crestor [Rosuvastatin]    Fenofibrate    Gabapentin    Lipitor [Atorvastatin]    Lisinopril    Pravastatin    Simvastatin      Medications  Current Outpatient Medications:    losartan (COZAAR) 50 MG tablet, Take 75 mg by mouth daily., Disp: , Rfl:    metoprolol tartrate (LOPRESSOR) 100 MG tablet, Take 100 mg by mouth daily., Disp: , Rfl:    nitroGLYCERIN (NITROSTAT) 0.4 MG SL tablet, Place 0.4 mg under the tongue every 5 (five) minutes as needed., Disp: , Rfl:    acetaminophen (TYLENOL) 650 MG CR tablet, Take 1,300 mg by mouth 2 (two) times daily., Disp: , Rfl:    albuterol (PROVENTIL HFA;VENTOLIN HFA) 108 (90 BASE) MCG/ACT inhaler, Inhale 2 puffs into the lungs every 6 (six) hours as needed. For shortness of breath, Disp: , Rfl:    aspirin EC 81 MG tablet, Take 81 mg by mouth daily. , Disp: , Rfl:    cholestyramine (QUESTRAN) 4 GM/DOSE powder, Take 1 packet (4 g total) by mouth daily., Disp: 378 g, Rfl: 0  cyclobenzaprine (FLEXERIL) 10 MG tablet, Take 10 mg by mouth 2 (two) times daily as needed. For muscle spasms, Disp: , Rfl:    Review of Systems Review of Systems  Constitutional:  Negative for chills and fever.  Eyes:  Negative for blurred vision and double vision.  Respiratory:  Negative for cough and shortness of breath.   Cardiovascular:  Negative for chest pain, palpitations and leg swelling.  Gastrointestinal:  Negative for abdominal pain, constipation, diarrhea, nausea and vomiting.  Musculoskeletal:  Positive for joint pain.  Neurological:  Negative for dizziness, weakness and headaches.       Objective:    Vitals BP 118/74 (BP Location: Right Arm, Patient Position: Sitting, Cuff Size: Normal)   Pulse 69   Temp 97.7 F (36.5 C) (Temporal)   Resp 18   Ht '5\' 2"'$  (1.575 m)   Wt 149 lb 6.4 oz (67.8 kg)   SpO2 99%   BMI 27.33 kg/m    Physical  Examination Physical Exam Constitutional:      Appearance: Normal appearance. She is not ill-appearing.  Cardiovascular:     Rate and Rhythm: Normal rate and regular rhythm.     Pulses: Normal pulses.     Heart sounds: No murmur heard.    No friction rub. No gallop.  Pulmonary:     Effort: Pulmonary effort is normal. No respiratory distress.     Breath sounds: No wheezing, rhonchi or rales.  Abdominal:     General: Abdomen is flat. Bowel sounds are normal. There is no distension.     Palpations: Abdomen is soft.     Tenderness: There is no abdominal tenderness.  Musculoskeletal:     Right lower leg: No edema.     Left lower leg: No edema.  Skin:    General: Skin is warm and dry.     Findings: No rash.  Neurological:     Mental Status: She is alert.        Assessment & Plan:   Essential hypertension Her BP is doing well today.  Continue on her current medications.  Overactive bladder She wants to hold off on meds and she does not want to see a urologist yet.  Stage 3a chronic kidney disease ([place]) I want her to avoid NSAIDS.  She has arthritis that effects her hands/fingers and she takes tylenol '1000mg'$  BID.  I do not want her on NSAIDS or celebrex due to her her history of CKD and CAD.    Return in about 3 months (around 11/06/2022).   Leslie Roger, MD

## 2022-11-01 ENCOUNTER — Other Ambulatory Visit: Payer: Self-pay | Admitting: Internal Medicine

## 2022-11-07 ENCOUNTER — Encounter: Payer: Self-pay | Admitting: Internal Medicine

## 2022-11-07 ENCOUNTER — Ambulatory Visit: Payer: PPO | Admitting: Internal Medicine

## 2022-11-07 VITALS — BP 158/82 | HR 75 | Temp 98.2°F | Resp 16 | Ht 62.0 in | Wt 144.2 lb

## 2022-11-07 DIAGNOSIS — E7800 Pure hypercholesterolemia, unspecified: Secondary | ICD-10-CM

## 2022-11-07 DIAGNOSIS — I2510 Atherosclerotic heart disease of native coronary artery without angina pectoris: Secondary | ICD-10-CM

## 2022-11-07 DIAGNOSIS — I10 Essential (primary) hypertension: Secondary | ICD-10-CM | POA: Diagnosis not present

## 2022-11-07 NOTE — Assessment & Plan Note (Signed)
She has CAD but denies any signs of symptoms.  We will check a FLP today but she does not want to be on a statin.  I have discussed nexletol with her but again we will continue risk factor modification and an ASA.

## 2022-11-07 NOTE — Progress Notes (Signed)
Office Visit  Subjective   Patient ID: [name]   DOB: 08-Nov-1940   Age: 82 y.o.   MRN: [id number]   Chief Complaint Chief Complaint  Patient presents with   Follow-up    Hypertension     History of Present Illness The patient is a 82 year old Caucasian/White female who presents for a follow-up of her hypertension.  She states her BP is elevated today as she is aggravated and left her cane at home.  Otherwise her BP has always been controlled.  The patient has been not checking her blood pressure at home. She is currently on losartan  daily and metoprolol tartrate  daily.  The patient denies any visual changes, dizziness, lightheadness, chest pain, shortness of breath, and edema. She reports there have been no other symptoms noted.  The patient also has a history of Stage IIIa CKD where we have noted there has been no change to her kidney function over the last several years.     [name] returns today for routine followup on her cholesterol.  We noted in 2019 that her cholesterol was elevated and her ASCVD score was 20%.  We tried her on red yeast rice but this upset her stomach.  She does not want to be on a statin.  She has been on zocor, pravastatin, atorvastatin and crestor which has caused her to have mental status changes.  Overall, she states she is doing well and is without any complaints or problems at this time. She specifically denies abdominal pain, nausea, vomiting, diarrhea, myalgias, and fatigue. She remains on dietary management as well as a regular exercise program and krill oil and cholestyramine.  We discussed nexlitol.  She is fasting in anticipation for labs today.   The patient has a history of mild CAD where in 2020 she was having symptoms of chest tightness where she was sent to cardiology.  She states that if she exerted herself or strained during a bowel movement, she would get chest tightness with pain that would  radiate into her left neck and down  her left arm.  She did have an ECHO stress test done in 03/2011 and this showed a negative treadmill EKG and normal LVEF 55-60% with no ischemia.  She had some chest pain in 2018 and underwent a heart catherization on 08/2016.  This showed non-obstructive CAD and she had a normal LVEF of 60%.  They felt she was clear but gave her NTG to use prn.  I referred her back to cardiology where they saw her in 05/2019 and felt she was having possible angina pectoris.  She underwent a repeat heart cath in 06/2019 which showed non-obstructive CAD with 10% stenosis of the LAD. Her LVEF was 65% without wall motion abnormality.  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  She followed up with cardiology in 07/2019 and they felt she was doing well.         Past Medical History Past Medical History:  Diagnosis Date   Arthritis    Asthma    Cervical spinal stenosis    CKD (chronic kidney disease), stage III    Complication of anesthesia    " difficulty breathing with back surgery 2013"   GERD (gastroesophageal reflux disease)    HypercholesterolemRICHELE STRANDtension    DR [name]  [place] CARD IN [place]   IBS (irritable bowel syndrome)    Insomnia    Lumbar stenosis  Meningioma    Multinodular goiter    Overactive bladder    Statin intolerance      Allergies Allergies  Allergen Reactions   Crestor [Rosuvastatin]    Fenofibrate    Gabapentin    Lipitor [Atorvastatin]    Lisinopril    Pravastatin    Simvastatin      Medications  Current Outpatient Medications:    acetaminophen (TYLENOL) 650 MG CR tablet, Take 1,300 mg by mouth 2 (two) times daily., Disp: , Rfl:    albuterol (PROVENTIL HFA;VENTOLIN HFA) 108 (90 BASE) MCG/ACT inhaler, Inhale 2 puffs into the lungs every 6 (six) hours as needed. For shortness of breath, Disp: , Rfl:    aspirin EC 81 MG tablet, Take 81 mg by mouth daily. , Disp: , Rfl:    cholestyramine (QUESTRAN) 4 GM/DOSE powder, take one SCOOP dissolved  in 2-6 ounces of water OR noncarbonated liquid AND drink DAILY, Disp: 378 g, Rfl: 0   cyclobenzaprine (FLEXERIL) 10 MG tablet, Take 10 mg by mouth 2 (two) times daily as needed. For muscle spasms, Disp: , Rfl:    losartan (COZAAR) 50 MG tablet, Take 1.5 tablets (75 mg total) by mouth daily., Disp: 135 tablet, Rfl: 3   metoprolol tartrate (LOPRESSOR) 100 MG tablet, Take 1 tablet (100 mg total) by mouth daily., Disp: 90 tablet, Rfl: 3   nitroGLYCERIN (NITROSTAT) 0.4 MG SL tablet, Place 0.4 mg under the tongue every 5 (five) minutes as needed., Disp: , Rfl:    Review of Systems Review of Systems  Constitutional:  Negative for chills and fever.  Eyes:  Negative for blurred vision and double vision.  Respiratory:  Negative for cough and shortness of breath.   Cardiovascular:  Negative for chest pain, palpitations and leg swelling.  Gastrointestinal:  Negative for abdominal pain, constipation, diarrhea, nausea and vomiting.  Musculoskeletal:  Negative for myalgias.  Skin:  Negative for itching and rash.  Neurological:  Negative for dizziness, weakness and headaches.  Psychiatric/Behavioral:  Negative for depression. The patient is not nervous/anxious.        Objective:    Vitals BP (!) 158/82   Pulse 75   Temp 98.2 F (36.8 C)   Resp 16   Ht  (1.575 m)   Wt 144 lb 3.2 oz (65.4 kg)   SpO2 98%   BMI 26.37 kg/m    Physical Examination Physical Exam Constitutional:      Appearance: Normal appearance. She is not ill-appearing.  Cardiovascular:     Rate and Rhythm: Normal rate and regular rhythm.     Pulses: Normal pulses.     Heart sounds: No murmur heard.    No friction rub. No gallop.  Pulmonary:     Effort: Pulmonary effort is normal. No respiratory distress.     Breath sounds: No wheezing, rhonchi or rales.  Abdominal:     General: Bowel sounds are normal. There is no distension.     Palpations: Abdomen is soft.     Tenderness: There is no abdominal tenderness.   Musculoskeletal:     Right lower leg: No edema.     Left lower leg: No edema.  Skin:    General: Skin is warm and dry.     Findings: No rash.  Neurological:     General: No focal deficit present.     Mental Status: She is alert and oriented to person, place, and time.  Psychiatric:        Mood and Affect: Mood  normal.        Behavior: Behavior normal.        Assessment & Plan:   CAD (coronary artery disease) She has CAD but denies any signs of symptoms.  We will check a FLP today but she does not want to be on a statin.  I have discussed nexletol with her but again we will continue risk factor modification and an ASA.  Essential hypertension Her BP is elevated due to her being aggravated.  We will see what her BP is doing next visit.  Hypercholesterolemia We will check a FLP on her today.    Return in about 3 months (around 02/06/2023) for annual.   Crist Fat, MD

## 2022-11-07 NOTE — Assessment & Plan Note (Signed)
We will check a FLP on her today.

## 2022-11-07 NOTE — Assessment & Plan Note (Signed)
Her BP is elevated due to her being aggravated.  We will see what her BP is doing next visit.

## 2022-11-08 LAB — LIPID PANEL
Chol/HDL Ratio: 2.9 ratio (ref 0.0–4.4)
Cholesterol, Total: 201 mg/dL — ABNORMAL HIGH (ref 100–199)
HDL: 69 mg/dL (ref >39)
LDL Chol Calc (NIH): 103 mg/dL — ABNORMAL HIGH (ref 0–99)
Triglycerides: 168 mg/dL — ABNORMAL HIGH (ref 0–149)
VLDL Cholesterol Cal: 29 mg/dL (ref 5–40)

## 2022-11-14 ENCOUNTER — Other Ambulatory Visit: Payer: Self-pay

## 2022-11-14 MED ORDER — EZETIMIBE 10 MG PO TABS: 10.0000 mg | ORAL_TABLET | Freq: Every day | ORAL | 1 refills | Status: DC

## 2022-11-25 DIAGNOSIS — L578 Other skin changes due to chronic exposure to nonionizing radiation: Secondary | ICD-10-CM | POA: Diagnosis not present

## 2022-11-25 DIAGNOSIS — L570 Actinic keratosis: Secondary | ICD-10-CM | POA: Diagnosis not present

## 2022-11-25 DIAGNOSIS — L821 Other seborrheic keratosis: Secondary | ICD-10-CM | POA: Diagnosis not present

## 2022-12-02 DIAGNOSIS — M79671 Pain in right foot: Secondary | ICD-10-CM | POA: Diagnosis not present

## 2022-12-02 DIAGNOSIS — I739 Peripheral vascular disease, unspecified: Secondary | ICD-10-CM | POA: Diagnosis not present

## 2022-12-02 DIAGNOSIS — M21612 Bunion of left foot: Secondary | ICD-10-CM | POA: Diagnosis not present

## 2022-12-02 DIAGNOSIS — M79672 Pain in left foot: Secondary | ICD-10-CM | POA: Diagnosis not present

## 2022-12-02 DIAGNOSIS — M21611 Bunion of right foot: Secondary | ICD-10-CM | POA: Diagnosis not present

## 2023-01-20 ENCOUNTER — Other Ambulatory Visit: Payer: Self-pay | Admitting: Internal Medicine

## 2023-01-20 DIAGNOSIS — L0100 Impetigo, unspecified: Secondary | ICD-10-CM | POA: Diagnosis not present

## 2023-02-13 ENCOUNTER — Ambulatory Visit: Payer: PPO | Admitting: Internal Medicine

## 2023-02-13 ENCOUNTER — Encounter: Payer: Self-pay | Admitting: Internal Medicine

## 2023-02-13 VITALS — BP 140/88 | HR 74 | Temp 98.2°F | Resp 18 | Ht 62.0 in | Wt 142.6 lb

## 2023-02-13 DIAGNOSIS — G4700 Insomnia, unspecified: Secondary | ICD-10-CM | POA: Diagnosis not present

## 2023-02-13 DIAGNOSIS — Z789 Other specified health status: Secondary | ICD-10-CM

## 2023-02-13 DIAGNOSIS — D329 Benign neoplasm of meninges, unspecified: Secondary | ICD-10-CM | POA: Diagnosis not present

## 2023-02-13 DIAGNOSIS — I10 Essential (primary) hypertension: Secondary | ICD-10-CM

## 2023-02-13 DIAGNOSIS — E7800 Pure hypercholesterolemia, unspecified: Secondary | ICD-10-CM | POA: Diagnosis not present

## 2023-02-13 DIAGNOSIS — I2510 Atherosclerotic heart disease of native coronary artery without angina pectoris: Secondary | ICD-10-CM

## 2023-02-13 DIAGNOSIS — N3281 Overactive bladder: Secondary | ICD-10-CM | POA: Diagnosis not present

## 2023-02-13 DIAGNOSIS — M159 Polyosteoarthritis, unspecified: Secondary | ICD-10-CM

## 2023-02-13 DIAGNOSIS — Z1231 Encounter for screening mammogram for malignant neoplasm of breast: Secondary | ICD-10-CM

## 2023-02-13 DIAGNOSIS — Z0000 Encounter for general adult medical examination without abnormal findings: Secondary | ICD-10-CM | POA: Diagnosis not present

## 2023-02-13 DIAGNOSIS — Z6826 Body mass index (BMI) 26.0-26.9, adult: Secondary | ICD-10-CM | POA: Insufficient documentation

## 2023-02-13 DIAGNOSIS — E042 Nontoxic multinodular goiter: Secondary | ICD-10-CM

## 2023-02-13 DIAGNOSIS — K219 Gastro-esophageal reflux disease without esophagitis: Secondary | ICD-10-CM | POA: Diagnosis not present

## 2023-02-13 DIAGNOSIS — J4520 Mild intermittent asthma, uncomplicated: Secondary | ICD-10-CM | POA: Insufficient documentation

## 2023-02-13 DIAGNOSIS — M48061 Spinal stenosis, lumbar region without neurogenic claudication: Secondary | ICD-10-CM

## 2023-02-13 DIAGNOSIS — N1831 Chronic kidney disease, stage 3a: Secondary | ICD-10-CM

## 2023-02-13 DIAGNOSIS — E559 Vitamin D deficiency, unspecified: Secondary | ICD-10-CM

## 2023-02-13 DIAGNOSIS — M4802 Spinal stenosis, cervical region: Secondary | ICD-10-CM

## 2023-02-13 DIAGNOSIS — K589 Irritable bowel syndrome without diarrhea: Secondary | ICD-10-CM | POA: Insufficient documentation

## 2023-02-13 MED ORDER — CHOLESTYRAMINE 4 GM/DOSE PO POWD: 4.0000 g | Freq: Every day | ORAL | 3 refills | Status: DC

## 2023-02-13 MED ORDER — ALBUTEROL SULFATE HFA 108 (90 BASE) MCG/ACT IN AERS: 2.0000 | INHALATION_SPRAY | Freq: Four times a day (QID) | RESPIRATORY_TRACT | 3 refills | Status: DC | PRN

## 2023-02-13 MED ORDER — METOPROLOL TARTRATE 100 MG PO TABS: 100.0000 mg | ORAL_TABLET | Freq: Every day | ORAL | 3 refills | Status: DC

## 2023-02-13 MED ORDER — LOSARTAN POTASSIUM 50 MG PO TABS: 75.0000 mg | ORAL_TABLET | Freq: Every day | ORAL | 3 refills | Status: DC

## 2023-02-13 MED ORDER — CYCLOBENZAPRINE HCL 10 MG PO TABS: 10.0000 mg | ORAL_TABLET | Freq: Two times a day (BID) | ORAL | 2 refills | Status: DC | PRN

## 2023-02-13 MED ORDER — NITROGLYCERIN 0.4 MG SL SUBL: 0.4000 mg | SUBLINGUAL_TABLET | SUBLINGUAL | 0 refills | Status: AC | PRN

## 2023-02-13 NOTE — Assessment & Plan Note (Signed)
She has Stage IIIa CKD probably due to her HTN.  We will avoid NSAIDS and check her CMP today.

## 2023-02-13 NOTE — Assessment & Plan Note (Signed)
She is no longer on a PPI but denies any reflux.  We will continue to monitor.

## 2023-02-13 NOTE — Assessment & Plan Note (Signed)
We will check a FLP today in the setting of CAD.

## 2023-02-13 NOTE — Progress Notes (Signed)
Preventive Screening-Counseling & Management     [name] is a 82 y.o. female who presents for Medicare Annual/Subsequent preventive examination.  [name] is a 82 year old Caucasian/White female who presents for her annual wellness exam. She is due for the following health maintenance studies: mammogram, visual exam, and screening labs. This patient's past medical history Asthma, Basal Cell Carcinoma, GERD, Hyperlipidemia, Hypertension, Benign Essential, Irritable Bowel Syndrome, Menopausal Syndrome, Osteoarthritis, and Vitamin D deficiency.   Her last eye exam was done in 05/2022 where optometry is following her for cataracts and a history of ocular migraines. She went for cataract surgery 05/2016 and another in 07/2016.  She states her vision is doing well today.  Her last mammogram was done in 06/2018 and was normal. She has been contacted by the hospital for a repeat mammogram but she has refused to go due to the COVID-19 pandemic.  She states today that she is willing to go back and restart mammogram screening.  Her last colonoscopy was 12/2007 and this demonstrated diverticulosis. They wanted to repeat this again in 2019 but the patient refused. She also refuses a cologuard and FIT test in the past. She does not want a repeat bone density but she tells me that she had a bone density around 2011 and it was normal. She exercises by riding a recumbent bike 4-5 days a week for 10 miles per day for exercise. She also now has a rowing machine and she alternates this with the bicycle. She has never smoked .  She does do yearly flu vaccines. She did have a pneumovax 23 vaccine near the age of 58 and again in 08/2019. She has had a zostavax vaccine in the past. She had a prevnar 13 vaccine done in 04/2019. She had a shingrix vaccine in 04/2019. She has had 3 COVID-19 vaccines including 1 booster. There is no depression or anxiety. The patient is on an ASA 81mg  daily.    The patient is a 81 year  old Caucasian/White female who presents for a follow-up of her hypertension.  On her last visit, her BP was elevated due to her being "aggravated at the time".  The patient has been not checking her blood pressure at home. She is currently on losartan 75mg  daily and metoprolol tartrate 100mg  daily.  The patient denies any visual changes, dizziness, lightheadness, chest pain, shortness of breath, and edema. She reports there have been no other symptoms noted.    The patient also has a history of Stage IIIa CKD where we have noted there has been no change to her kidney function over the last several years from about 2019.  Her baseline creatinine ranges 0.9-1.1 with a GFR of 47-62.  She denies any NSAID use.     [name] returns today for routine followup on her cholesterol.  We noted in 2019 that her cholesterol was elevated and her ASCVD score was 20%.  We tried her on red yeast rice but this upset her stomach.  She does not want to be on a statin.  She has been on zocor, pravastatin, atorvastatin and crestor which has caused her to have mental status changes.  Overall, she states she is doing well and is without any complaints or problems at this time. She specifically denies abdominal pain, nausea, vomiting, diarrhea, myalgias, and fatigue. She remains on dietary management as well as a regular exercise program and krill oil and cholestyramine.  We discussed nexlitol.  She used  to be on zetia years ago. She is fasting in anticipation for labs today.   The patient has a history of mild CAD where in 2020 she was having symptoms of chest tightness where she was sent to cardiology.  She states that if she exerted herself or strained during a bowel movement, she would get chest tightness with pain that would  radiate into her left neck and down her left arm.  She did have an ECHO stress test done in 03/2011 and this showed a negative treadmill EKG and normal LVEF 55-60% with no ischemia.  She had some chest  pain in 2018 and underwent a heart catherization on 08/2016.  This showed non-obstructive CAD and she had a normal LVEF of 60%.  They felt she was clear but gave her NTG to use prn.  I referred her back to cardiology where they saw her in 05/2019 and felt she was having possible angina pectoris.  She underwent a repeat heart cath in 06/2019 which showed non-obstructive CAD with 10% stenosis of the LAD. Her LVEF was 65% without wall motion abnormality.  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  Her last visit with cardiology was in 07/2019 and they felt she was doing well.    [name] returns for followup of overactive bladder with urinary frequency.   I wanted to start her on myrbetriq but she is not interested in taking a medication.   I also wanted to refer her to urology but she does not want to do this yet.  She has tried a trial of gemtesa but she states this did not help.  There is no f/c, n/v, low back pain, abdominal pain, dysuria, hematuria or other problems.  She does states she get up about 4 times at night to urinate.  However during the day she may urinate 17 times.  She states she does not feel like she empties her bladder where she will urinate, stop and then go again.  She denies any leakage with she coughs or sneezes.  She wants to just follow this and hold off on referral to a urologist.   Last year on her yearly exam in 2023, she was stating she was having problems with both short and long term memory.  She also has had a fall where she states it feels like she does not pick up her feet.  The patient uses a one point cane.   She denied any generalized weakness but states she will just be walking and would fall.  There has been no LOC and no major injuries.  We did lab testing for dementia and her labs were normal.  I sent her for a CT scan of her brain which was done on 10/09/2021 and this showed no acute abnormality.  There was chronic microvascular ischemic changes but  she had a hyperdense dural based lesion over the partial lobe on the right.  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  Due to her falls, I wanted to refer her to neurosurgery but she refused at that time.  Today, she denies any falls and she states she has no more problems with her memory.  She scored a 30/30 on the MMSE today on 02/13/2023.  Mrs. Wiggin also has a history of IBS where she is  followed by GI.  She has a predominantly diarrhea component of her IBS.  She did see GI in 09/2020 and they started her on a trial of questran.  She states this has helped alot and her IBS is essentially resolved.  She can have abdominal pain at times and she does take her dicyclomine as needed.  She denies any problems with IBS today.  She also has a history of insomnia which has been going on for years.  There has been no change in her insomnia for the last year.   She did try a trial of melatonin and self titrated appropriately but it did not help her sleep.   She wonders if pain is keeping her up where she has arthritic pain in her hands, shoulders, and hips.  Her hands and hips are the worse where she has continuous dull aching pain that can worsen at times.   She is not snoring and is not gasping when she breaths.  I did place her on Celebrex in 11/2019 and she states this has helped.  The patient no longer takes Celebrex as she states she did not see anything effect.  She takes 2 extra strength Tylenol and that seems to help.    Mrs. Smilowitz also has a history of back pain with a history of lumbar and cervical stenosis.  She was followed by Dr. Phoebe Perch in neurosurgery but he has now moved and she does not see him or any other neurosugeon anymore.  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  She has a history of lumbar laminectomy  and fusion and underwent rod placement in the past of her cervical and lower spine.  She was seen by me in the past due to headaches.  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  The patient was referred to neurology at [facility] who felt her headaches were due cervicogenic headaches from her back pain.  They did a MRI of her neck and brain and this showed subcortical white matter changes but nothing acute.  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  They discussed ESI but she did not want to do this.  I placed her back on a trial of meloxicam but she states she had side effects of nausea and stopped this.  Tylenol still controls her pain symptoms.  She also uses cyclobenzaprine as needed for pain.    The patient also presents mild asthma of years duration and presents today for a status visit.  This was diagnosed as adult onset asthma. She uses her inhaler intermittently where she may use it every day or could go 2-3 weeks without using it.  There is no history of allergies.  See PMH for summary of pulmonary history and lab reports for status of any previous PFTs: Asthma. Comorbid conditions : none. She has no baseline symptoms of asthma. The patient's condition is controlled by medications as summarized in the medication list on the face sheet. She has no modifiable risk factors. Specifically denied complaints: worsening exertional dyspnea, increased wheezing, productive cough, fever, and nocturnal wheezing.    The patient returns for history of anxiety. This remains resolved and was situational and was related to when her mother was going into a nursing home. This has not recurred. She reports no additional symptoms She denies fatigue,  weight loss, insomnia, out of control feelings, and panic attacks. This patient feels that she is able to care for herself. She currently  lives with her husband. She has no significant prior history of mental health disorders.  She denies any depression or anxiety today.  She did have a basal cell carcinoma removed from her chin, eyelid, ear and cheek and is followed every 6 months by dermatology.    She does have a history of reflux where she was on prilosec which had stopped her reflux.  She tells me today that she has stopped the prilosec and she denies any symptoms today.    She also received a workup for goiter in the past where she underwent thyroid ultraound and she did have FNA of her enlarged thyroid which was benign.  Ultimately, she was found to have multinodular goiter.  We did repeat a thyroid US on 02/2018.  This showed multiple nodules which were stable and did not need biopsy. She denies any symptoms.           Are there smokers in your home (other than you)? No  Risk Factors Current exercise habits:  as above   Dietary issues discussed: none   Depression Screen (Note: if answer to either of the following is "Yes", a more complete depression screening is indicated)   Over the past two weeks, have you felt down, depressed or hopeless? No  Over the past two weeks, have you felt little interest or pleasure in doing things? No  Have you lost interest or pleasure in daily life? No  Do you often feel hopeless? No  Do you cry easily over simple problems? No  Activities of Daily Living In your present state of health, do you have any difficulty performing the following activities?:  Driving? No Managing money?  No Feeding yourself? No Getting from bed to chair? No Climbing a flight of stairs? No Preparing food and eating?: No Bathing or showering? No Getting dressed: No Getting to the toilet? No Using the toilet:No Moving around from place to place: No In the past year have you fallen or had a near fall?:No   Are you sexually active?  No  Do you have more than one partner?  No  Hearing  Difficulties: Yes Do you often ask people to speak up or repeat themselves? No Do you experience ringing or noises in your ears? Yes Do you have difficulty understanding soft or whispered voices? No   Do you feel that you have a problem with memory? No  Do you often misplace items? No  Do you feel safe at home?  Yes  Cognitive Testing  Alert? Yes  Normal Appearance?Yes  Oriented to person? Yes  Place? Yes   Time? Yes  Recall of three objects?  Yes  Can perform simple calculations? Yes  Displays appropriate judgment?Yes  Can read the correct time from a watch face?Yes  Fall Risk Prevention  Any stairs in or around the home? No  If so, are there any without handrails? No  Home free of loose throw rugs in walkways, pet beds, electrical cords, etc? Yes  Adequate lighting in your home to reduce risk of falls? Yes  Use of a cane, walker or w/c? Yes    Time Up and Go  Was the test performed? Yes .  Length of time to ambulate 10 feet: 17 sec.   Gait slow and steady with assistive device    Advanced Directives have been discussed with  the patient? Yes   List the Names of Other Physician/Practitioners you currently use: Patient Care Team: Crist Fat, MD as PCP - General (Internal Medicine)    Past Medical History:  Diagnosis Date   Arthritis    Asthma    Cervical spinal stenosis    CKD (chronic kidney disease), stage III (HCC)    Complication of anesthesia    " difficulty breathing with back surgery 2013"   GERD (gastroesophageal reflux disease)    Hypercholesterolemia    Hypertension    DR [name]  [place] [facility] [place]   IBS (irritable bowel syndrome)    Insomnia    Lumbar stenosis    Meningioma (HCC)    Multinodular goiter    Overactive bladder    Statin intolerance    Vitamin D deficiency     Past Surgical History:  Procedure Laterality Date   ABDOMINAL HYSTERECTOMY     1996   BACK SURGERY     2010   CARDIAC CATHETERIZATION     1998  HIGH PT    CERVICAL DISC SURGERY     12/2008   CHOLECYSTECTOMY     LEFT HEART CATH AND CORONARY ANGIOGRAPHY N/A 06/28/2019   Procedure: LEFT HEART CATH AND CORONARY ANGIOGRAPHY;  Surgeon: Kathleene Hazel, MD;  Location: [facility]VASIVE [facility];  Service: Cardiovascular;  Laterality: N/A;   LUMBAR FUSION        Current Medications  Current Outpatient Medications  Medication Sig Dispense Refill   acetaminophen (TYLENOL) 650 MG CR tablet Take 1,300 mg by mouth 2 (two) times daily.     albuterol (PROVENTIL HFA;VENTOLIN HFA) 108 (90 BASE) MCG/ACT inhaler Inhale 2 puffs into the lungs every 6 (six) hours as needed. For shortness of breath     aspirin EC 81 MG tablet Take 81 mg by mouth daily.      cholestyramine (QUESTRAN) 4 GM/DOSE powder take one SCOOP dissolved in 2-6 ounces of water OR noncarbonated liquid AND drink DAILY 378 g 0   cyclobenzaprine (FLEXERIL) 10 MG tablet Take 10 mg by mouth 2 (two) times daily as needed. For muscle spasms     losartan (COZAAR) 50 MG tablet Take 1.5 tablets (75 mg total) by mouth daily. 135 tablet 3   metoprolol tartrate (LOPRESSOR) 100 MG tablet Take 1 tablet (100 mg total) by mouth daily. 90 tablet 3   nitroGLYCERIN (NITROSTAT) 0.4 MG SL tablet Place 0.4 mg under the tongue every 5 (five) minutes as needed.     No current facility-administered medications for this visit.    Allergies Crestor [rosuvastatin], Fenofibrate, Gabapentin, Lipitor [atorvastatin], Lisinopril, Pravastatin, and Simvastatin   Social History Social History   Tobacco Use   Smoking status: Never   Smokeless tobacco: Never  Substance Use Topics   Alcohol use: Yes    Comment: OCC     Review of Systems Review of Systems  Constitutional:  Negative for chills, fever, malaise/fatigue and weight loss.  HENT:  Positive for hearing loss and tinnitus.   Eyes:  Negative for blurred vision and double vision.  Respiratory:  Negative for cough, hemoptysis, shortness of breath and wheezing.    Cardiovascular:  Negative for chest pain, palpitations and leg swelling.  Gastrointestinal:  Negative for abdominal pain, blood in stool, constipation, diarrhea, heartburn, melena, nausea and vomiting.  Genitourinary:  Positive for frequency. Negative for dysuria and hematuria.  Musculoskeletal:  Positive for back pain. Negative for myalgias.  Skin:  Negative for itching and rash.  Neurological:  Negative for dizziness, weakness and headaches.  Endo/Heme/Allergies:  Negative for polydipsia.  Psychiatric/Behavioral:  Negative for depression. The patient has insomnia. The patient is not nervous/anxious.      Physical Exam:      Body mass index is 26.08 kg/m. BP (!) 140/88   Pulse 74   Temp 98.2 F (36.8 C)   Resp 18   Ht 5\' 2"  (1.575 m)   Wt 142 lb 9.6 oz (64.7 kg)   SpO2 98%   BMI 26.08 kg/m   Physical Exam Constitutional:      Appearance: Normal appearance. She is not ill-appearing.  HENT:     Head: Normocephalic and atraumatic.     Right Ear: Tympanic membrane, ear canal and external ear normal.     Left Ear: Tympanic membrane, ear canal and external ear normal.     Nose: Nose normal. No congestion or rhinorrhea.     Mouth/Throat:     Mouth: Mucous membranes are moist.     Pharynx: Oropharynx is clear. No posterior oropharyngeal erythema.  Eyes:     General: No scleral icterus.    Conjunctiva/sclera: Conjunctivae normal.     Pupils: Pupils are equal, round, and reactive to light.  Neck:     Thyroid: No thyromegaly.     Vascular: No carotid bruit.  Cardiovascular:     Rate and Rhythm: Normal rate and regular rhythm.     Pulses: Normal pulses.     Heart sounds: Normal heart sounds. No murmur heard.    No friction rub. No gallop.  Pulmonary:     Effort: Pulmonary effort is normal. No respiratory distress.     Breath sounds: Normal breath sounds. No wheezing, rhonchi or rales.  Abdominal:     General: Abdomen is flat. Bowel sounds are normal. There is no  distension.     Palpations: Abdomen is soft.     Tenderness: There is no abdominal tenderness.  Musculoskeletal:     Cervical back: Normal range of motion. No tenderness.     Right lower leg: No edema.     Left lower leg: No edema.     Comments: No clubbing or cyanosis  Lymphadenopathy:     Cervical: No cervical adenopathy.  Skin:    General: Skin is warm and dry.     Findings: No rash.  Neurological:     General: No focal deficit present.     Mental Status: She is alert and oriented to person, place, and time.     Comments: CN II-XII grossly intact  Psychiatric:        Mood and Affect: Mood normal.        Behavior: Behavior normal.      Assessment:      Essential hypertension  Hypercholesterolemia  Coronary artery disease involving native coronary artery of native heart without angina pectoris  Overactive bladder  Stage 3a chronic kidney disease (HCC)  Meningioma (HCC)  Gastroesophageal reflux disease, unspecified whether esophagitis present  Mild intermittent asthma without complication  Insomnia, unspecified type  Multinodular goiter  Irritable bowel syndrome, unspecified type  Spinal stenosis of lumbar region without neurogenic claudication  Cervical spinal stenosis  Statin intolerance  Primary osteoarthritis involving multiple joints  BMI 26.0-26.9,adult  Vitamin D deficiency  Screening mammogram for breast cancer    Plan:     During the course of the visit the patient was educated and counseled about appropriate screening and preventive services including:   Pneumococcal vaccine  Influenza vaccine Screening  mammography Bone densitometry screening Colorectal cancer screening Advanced Directives  Diet review for nutrition referral? Yes ____  Not Indicated _X___   Patient Instructions (the written plan) was given to the patient.  Essential hypertension Her BP is borderline.  WE will see what her BP is doing on her next visit before  adjusting her medications.  CAD (coronary artery disease) She denies any angina at this time.  We will continue with secondary prevention with risk factor modfication.  She cannot be on a statin due to statin intolerance.  We will continue an ASA.  Mild intermittent asthma without complication Her asthma is stable and she uses her albuterol HFA as needed.  Irritable bowel syndrome Her IBS is controlled with Lanetta Inch which we will continue.  Gastroesophageal reflux disease She is no longer on a PPI but denies any reflux.  We will continue to monitor.  Multinodular goiter I see no enlargement on her thyroid exam today.  We will check her TSH.  Meningioma [place]) She had a MRI done of her brain a few years ago and there was no change to her meningioma.  We discussed surveillance screening with yearly MRI but she does not want to do this.  Primary osteoarthritis involving multiple joints She can use voltaren gel and tylenol prn for arthritis in her hands/fingers.  Stage 3a chronic kidney disease (HCC) She has Stage IIIa CKD probably due to her HTN.  We will avoid NSAIDS and check her CMP today.  Overactive bladder She does not want a urology referral and just wants to follow.  Statin intolerance She is on [place] but has a intolerance to statins in the setting of CAD.  She was on zetia in the past.  Spinal stenosis of lumbar region without neurogenic claudication She will take tylenol as needed for neck and lower back pain.  Insomnia We discussed medications for her insomnia but she does not want any new medications.  She is trying to adjust her sleep pattern.  Hypercholesterolemia We will check a FLP today in the setting of CAD.  Cervical spinal stenosis Plan as above.  BMI 26.0-26.9,adult I want her to continue to eat healthy and continue to be active with her exercise.   Prevention Health maintenance discussed.  We will arrange for a mammogram.  She has aged out for  colonoscopy.  She does not want a repeat bone scan.  We will obtain some yearly labs.  Medicare Attestation I have personally reviewed: The patient's medical and social history Their use of alcohol, tobacco or illicit drugs Their current medications and supplements The patient's functional ability including ADLs,fall risks, home safety risks, cognitive, and hearing and visual impairment Diet and physical activities Evidence for depression or mood disorders  The patient's weight, height, and BMI have been recorded in the chart.  I have made referrals, counseling, and provided education to the patient based on review of the above and I have provided the patient with a written personalized care plan for preventive services.     Crist Fat, MD   02/13/2023

## 2023-02-13 NOTE — Assessment & Plan Note (Signed)
She is on [place] but has a intolerance to statins in the setting of CAD.  She was on zetia in the past.

## 2023-02-13 NOTE — Assessment & Plan Note (Signed)
She does not want a urology referral and just wants to follow.

## 2023-02-13 NOTE — Assessment & Plan Note (Signed)
Her BP is borderline.  WE will see what her BP is doing on her next visit before adjusting her medications.

## 2023-02-13 NOTE — Assessment & Plan Note (Signed)
I want her to continue to eat healthy and continue to be active with her exercise.

## 2023-02-13 NOTE — Assessment & Plan Note (Signed)
Her IBS is controlled with Lanetta Inch which we will continue.

## 2023-02-13 NOTE — Assessment & Plan Note (Signed)
I see no enlargement on her thyroid exam today.  We will check her TSH.

## 2023-02-13 NOTE — Assessment & Plan Note (Signed)
We discussed medications for her insomnia but she does not want any new medications.  She is trying to adjust her sleep pattern.

## 2023-02-13 NOTE — Assessment & Plan Note (Signed)
She will take tylenol as needed for neck and lower back pain.

## 2023-02-13 NOTE — Assessment & Plan Note (Signed)
She can use voltaren gel and tylenol prn for arthritis in her hands/fingers.

## 2023-02-13 NOTE — Assessment & Plan Note (Signed)
She denies any angina at this time.  We will continue with secondary prevention with risk factor modfication.  She cannot be on a statin due to statin intolerance.  We will continue an ASA.

## 2023-02-13 NOTE — Assessment & Plan Note (Signed)
Plan as above.  

## 2023-02-13 NOTE — Assessment & Plan Note (Signed)
Her asthma is stable and she uses her albuterol HFA as needed.

## 2023-02-13 NOTE — Assessment & Plan Note (Signed)
She had a MRI done of her brain a few years ago and there was no change to her meningioma.  We discussed surveillance screening with yearly MRI but she does not want to do this.

## 2023-02-18 DIAGNOSIS — Z1231 Encounter for screening mammogram for malignant neoplasm of breast: Secondary | ICD-10-CM | POA: Diagnosis not present

## 2023-02-20 NOTE — Progress Notes (Signed)
Her labs look good. Her cholesterol is a bit elevated but it's really not that bad. She is on cholestyramine which helps.  Patient notified.

## 2023-03-18 DIAGNOSIS — B079 Viral wart, unspecified: Secondary | ICD-10-CM | POA: Diagnosis not present

## 2023-04-23 ENCOUNTER — Encounter: Payer: Self-pay | Admitting: Internal Medicine

## 2023-05-15 ENCOUNTER — Encounter: Payer: Self-pay | Admitting: Internal Medicine

## 2023-05-15 ENCOUNTER — Ambulatory Visit: Payer: PPO | Admitting: Internal Medicine

## 2023-05-15 VITALS — BP 122/80 | HR 72 | Temp 98.0°F | Resp 16 | Ht 62.0 in | Wt 145.2 lb

## 2023-05-15 DIAGNOSIS — E7800 Pure hypercholesterolemia, unspecified: Secondary | ICD-10-CM

## 2023-05-15 DIAGNOSIS — N1831 Chronic kidney disease, stage 3a: Secondary | ICD-10-CM | POA: Diagnosis not present

## 2023-05-15 DIAGNOSIS — I10 Essential (primary) hypertension: Secondary | ICD-10-CM | POA: Diagnosis not present

## 2023-05-15 MED ORDER — LOSARTAN POTASSIUM 50 MG PO TABS: 75.0000 mg | ORAL_TABLET | Freq: Every day | ORAL | 3 refills | Status: DC

## 2023-05-15 MED ORDER — CHOLESTYRAMINE 4 GM/DOSE PO POWD: 4.0000 g | Freq: Every day | ORAL | 3 refills | Status: DC

## 2023-05-15 MED ORDER — METOPROLOL TARTRATE 100 MG PO TABS: 100.0000 mg | ORAL_TABLET | Freq: Every day | ORAL | 3 refills | Status: DC

## 2023-05-15 NOTE — Assessment & Plan Note (Signed)
Her cholesterol is elevated but she cannot stand a statin.  I want her to continue to eat healthy, cut fats in her diet and keep active.

## 2023-05-15 NOTE — Assessment & Plan Note (Signed)
There is no change in her CKD.  She is to avoid NSAIDS.

## 2023-05-15 NOTE — Progress Notes (Addendum)
Office Visit  Subjective   Patient ID: [name]   DOB: 02-Nov-1940   Age: 82 y.o.   MRN: [id number]   Chief Complaint Chief Complaint  Patient presents with   Follow-up     History of Present Illness The patient is a 82 year old Caucasian/White female who presents for a follow-up of her hypertension.  On her last 2 visits, her BP was elevated and we have brought her back today to see what it is doing.  The patient has been not checking her blood pressure at home. She is currently on losartan 75mg  daily and metoprolol tartrate 100mg  daily.  The patient denies any visual changes, dizziness, lightheadness, chest pain, shortness of breath, and edema. She reports there have been no other symptoms noted.     The patient also has a history of Stage IIIa CKD where we have noted there has been no change to her kidney function over the last several years from about 2019.  Her baseline creatinine ranges 0.9-1.1 with a GFR of 47-62.  She denies any NSAID use.     [name] returns today for routine followup on her cholesterol.  We noted in 2019 that her cholesterol was elevated and her ASCVD score was 20%.  We tried her on red yeast rice but this upset her stomach.  She does not want to be on a statin.  She has been on zocor, pravastatin, atorvastatin and crestor which has caused her to have mental status changes.  On her last visit, her cholesterol was elevated but again we just asked her to watch her diet and keep active.  Overall, she states she is doing well and is without any complaints or problems at this time. She specifically denies abdominal pain, nausea, vomiting, diarrhea, myalgias, and fatigue. She remains on dietary management as well as a regular exercise program and krill oil and cholestyramine.  We discussed nexlitol.  She used to be on zetia years ago. She is fasting in anticipation for labs today.       Past Medical History Past Medical History:  Diagnosis Date   Arthritis     Asthma    Cervical spinal stenosis    CKD (chronic kidney disease), stage III (HCC)    Complication of anesthesia    " difficulty breathing with back surgery 2013"   GERD (gastroesophageal reflux disease)    Hypercholesterolemia    Hypertension    DR [name]  [place] CARD IN [place]   IBS (irritable bowel syndrome)    Insomnia    Lumbar stenosis    Meningioma (HCC)    Multinodular goiter    Overactive bladder    Statin intolerance    Vitamin D deficiency      Allergies Allergies  Allergen Reactions   Crestor [Rosuvastatin]    Fenofibrate    Gabapentin    Lipitor [Atorvastatin]    Lisinopril    Pravastatin    Simvastatin      Medications  Current Outpatient Medications:    acetaminophen (TYLENOL) 650 MG CR tablet, Take 1,300 mg by mouth 2 (two) times daily., Disp: , Rfl:    albuterol (VENTOLIN HFA) 108 (90 Base) MCG/ACT inhaler, Inhale 2 puffs into the lungs every 6 (six) hours as needed. For shortness of breath, Disp: 1 each, Rfl: 3   aspirin EC 81 MG tablet, Take 81 mg by mouth daily. , Disp: , Rfl:    cholestyramine (QUESTRAN) 4 GM/DOSE powder, Take 1 packet (4  g total) by mouth daily., Disp: 378 g, Rfl: 3   cyclobenzaprine (FLEXERIL) 10 MG tablet, Take 1 tablet (10 mg total) by mouth 2 (two) times daily as needed. For muscle spasms, Disp: 30 tablet, Rfl: 2   losartan (COZAAR) 50 MG tablet, Take 1.5 tablets (75 mg total) by mouth daily., Disp: 135 tablet, Rfl: 3   metoprolol tartrate (LOPRESSOR) 100 MG tablet, Take 1 tablet (100 mg total) by mouth daily., Disp: 90 tablet, Rfl: 3   nitroGLYCERIN (NITROSTAT) 0.4 MG SL tablet, Place 1 tablet (0.4 mg total) under the tongue every 5 (five) minutes as needed., Disp: 30 tablet, Rfl: 0   Review of Systems Review of Systems  Constitutional:  Negative for fever.  Eyes:  Negative for blurred vision and double vision.  Respiratory:  Negative for cough and shortness of breath.   Cardiovascular:  Negative for chest pain,  palpitations and leg swelling.  Gastrointestinal:  Negative for abdominal pain, constipation, diarrhea, nausea and vomiting.  Musculoskeletal:  Negative for myalgias.  Skin:  Negative for itching and rash.  Neurological:  Negative for dizziness, weakness and headaches.       Objective:    Vitals BP 122/80   Pulse 72   Temp 98 F (36.7 C)   Resp 16   Ht 5\' 2"  (1.575 m)   Wt 145 lb 3.2 oz (65.9 kg)   BMI 26.56 kg/m    Physical Examination Physical Exam Constitutional:      Appearance: Normal appearance. She is not ill-appearing.  Cardiovascular:     Rate and Rhythm: Normal rate and regular rhythm.     Pulses: Normal pulses.     Heart sounds: No murmur heard.    No friction rub. No gallop.  Pulmonary:     Effort: Pulmonary effort is normal. No respiratory distress.     Breath sounds: No wheezing, rhonchi or rales.  Abdominal:     General: Bowel sounds are normal. There is no distension.     Palpations: Abdomen is soft.     Tenderness: There is no abdominal tenderness.  Musculoskeletal:     Right lower leg: No edema.     Left lower leg: No edema.  Skin:    General: Skin is warm and dry.     Findings: No rash.  Neurological:     Mental Status: She is alert.        Assessment & Plan:   Essential hypertension Her BP is doing well today.  We will continue her current medication and continue to monitor her BP.  Stage 3a chronic kidney disease (HCC) There is no change in her CKD.  She is to avoid NSAIDS.  Hypercholesterolemia Her cholesterol is elevated but she cannot stand a statin.  I want her to continue to eat healthy, cut fats in her diet and keep active.    Return in about 3 months (around 08/15/2023).   Crist Fat, MD

## 2023-05-15 NOTE — Assessment & Plan Note (Signed)
Her BP is doing well today.  We will continue her current medication and continue to monitor her BP.

## 2023-05-19 DIAGNOSIS — L578 Other skin changes due to chronic exposure to nonionizing radiation: Secondary | ICD-10-CM | POA: Diagnosis not present

## 2023-05-19 DIAGNOSIS — L570 Actinic keratosis: Secondary | ICD-10-CM | POA: Diagnosis not present

## 2023-05-19 DIAGNOSIS — L821 Other seborrheic keratosis: Secondary | ICD-10-CM | POA: Diagnosis not present

## 2023-06-02 DIAGNOSIS — H524 Presbyopia: Secondary | ICD-10-CM | POA: Diagnosis not present

## 2023-06-02 DIAGNOSIS — H5203 Hypermetropia, bilateral: Secondary | ICD-10-CM | POA: Diagnosis not present

## 2023-06-02 DIAGNOSIS — H3530 Unspecified macular degeneration: Secondary | ICD-10-CM | POA: Diagnosis not present

## 2023-06-02 DIAGNOSIS — H52223 Regular astigmatism, bilateral: Secondary | ICD-10-CM | POA: Diagnosis not present

## 2023-06-02 DIAGNOSIS — Z9841 Cataract extraction status, right eye: Secondary | ICD-10-CM | POA: Diagnosis not present

## 2023-06-02 DIAGNOSIS — H353131 Nonexudative age-related macular degeneration, bilateral, early dry stage: Secondary | ICD-10-CM | POA: Diagnosis not present

## 2023-06-02 DIAGNOSIS — Z9842 Cataract extraction status, left eye: Secondary | ICD-10-CM | POA: Diagnosis not present

## 2023-06-02 DIAGNOSIS — H04123 Dry eye syndrome of bilateral lacrimal glands: Secondary | ICD-10-CM | POA: Diagnosis not present

## 2023-06-02 DIAGNOSIS — Z961 Presence of intraocular lens: Secondary | ICD-10-CM | POA: Diagnosis not present

## 2023-06-19 ENCOUNTER — Ambulatory Visit: Payer: PPO | Admitting: Podiatry

## 2023-06-19 DIAGNOSIS — M71372 Other bursal cyst, left ankle and foot: Secondary | ICD-10-CM

## 2023-06-19 DIAGNOSIS — M2042 Other hammer toe(s) (acquired), left foot: Secondary | ICD-10-CM

## 2023-06-19 NOTE — Progress Notes (Signed)
Chief Complaint  Patient presents with   Callouses    Left 5th corn been painful for the past 6 months, she has been using corn patches and it got better then it got worse   HPI: 82 y.o. female presents today with concern of a sore corn on the left fifth toe.  Patient states that she has been applying acid corn remover pads to the area, but this has not been helping her pain.  She denies injury to the toe.  Past Medical History:  Diagnosis Date   Arthritis    Asthma    Cervical spinal stenosis    CKD (chronic kidney disease), stage III (HCC)    Complication of anesthesia    " difficulty breathing with back surgery 2013"   GERD (gastroesophageal reflux disease)    Hypercholesterolemia    Hypertension    DR [name]  [place] CARD IN [place]   IBS (irritable bowel syndrome)    Insomnia    Lumbar stenosis    Meningioma (HCC)    Multinodular goiter    Overactive bladder    Statin intolerance    Vitamin D deficiency     Past Surgical History:  Procedure Laterality Date   ABDOMINAL HYSTERECTOMY     1996   BACK SURGERY     2010   CARDIAC CATHETERIZATION     1998  HIGH PT   CERVICAL DISC SURGERY     12/2008   CHOLECYSTECTOMY     LEFT HEART CATH AND CORONARY ANGIOGRAPHY N/A 06/28/2019   Procedure: LEFT HEART CATH AND CORONARY ANGIOGRAPHY;  Surgeon: Kathleene Hazel, MD;  Location: [facility];  Service: Cardiovascular;  Laterality: N/A;   LUMBAR FUSION      Allergies  Allergen Reactions   Crestor [Rosuvastatin]    Fenofibrate    Gabapentin    Lipitor [Atorvastatin]    Lisinopril    Pravastatin    Simvastatin     Physical Exam: Palpable pedal pulses left foot.  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  There is superficial abrasion/raw skin to the dorsal aspect of the PIP joint at the site of the acid pad placement.  There is mild localized edema and erythema.  No purulence or necrosis is seen.  There is pain on  palpation to this area.  Assessment/Plan of Care: 1. Other bursal cyst, left ankle and foot   2. Hammertoe of left foot     Discussed clinical findings with patient today.  With patient's consent a corticosteroid injection was administered to the left fifth toe beneath the painful skin lesion consisting of a mixture of 1% lidocaine plain, 0.5% Sensorcaine plain, and Kenalog 10 for total of 1 cc administered.  A Band-Aid was applied.  Patient may remove this later today.  Will have the patient apply antibiotic ointment to the area and follow-up in 1 to 2 weeks for reevaluation once the skin has healed over slightly.  She is not to use any acid pads in the meantime.   Clerance Lav, DPM, FACFAS [facility]     [address], [place] [address]  Office [phone]  Fax [phone]

## 2023-07-04 ENCOUNTER — Ambulatory Visit: Payer: PPO | Admitting: Podiatry

## 2023-07-04 DIAGNOSIS — M2042 Other hammer toe(s) (acquired), left foot: Secondary | ICD-10-CM

## 2023-07-04 NOTE — Progress Notes (Unsigned)
      Chief Complaint  Patient presents with   Toe Pain    LT 5TH, RECHECK. PT STATES IT IS MUCH BETTER WITH SOME MINOR PAIN. LOOKS GOOD.    HPI: 82 y.o. female presents today for follow-up of left fifth toe pain.  She was given a cortisone injection on the last visit as the toe was very inflamed and painful.  She notes that she is not having any pain today and that the swelling has gone down.  Past Medical History:  Diagnosis Date   Arthritis    Asthma    Cervical spinal stenosis    CKD (chronic kidney disease), stage III (HCC)    Complication of anesthesia    " difficulty breathing with back surgery 2013"   GERD (gastroesophageal reflux disease)    Hypercholesterolemia    Hypertension    DR [name]  [place] CARD IN [place]   IBS (irritable bowel syndrome)    Insomnia    Lumbar stenosis    Meningioma (HCC)    Multinodular goiter    Overactive bladder    Statin intolerance    Vitamin D deficiency     Past Surgical History:  Procedure Laterality Date   ABDOMINAL HYSTERECTOMY     1996   BACK SURGERY     2010   CARDIAC CATHETERIZATION     1998  HIGH PT   CERVICAL DISC SURGERY     12/2008   CHOLECYSTECTOMY     LEFT HEART CATH AND CORONARY ANGIOGRAPHY N/A 06/28/2019   Procedure: LEFT HEART CATH AND CORONARY ANGIOGRAPHY;  Surgeon: Kathleene Hazel, MD;  Location: [facility];  Service: Cardiovascular;  Laterality: N/A;   LUMBAR FUSION      Allergies  Allergen Reactions   Crestor [Rosuvastatin]    Fenofibrate    Gabapentin    Lipitor [Atorvastatin]    Lisinopril    Pravastatin    Simvastatin     Physical Exam: Palpable pedal pulses noted.  No edema or erythema remains in the left fifth toe.  The toe has a slight adductovarus position.  No associated corn is noted on the toe.  No pain on palpation of the toe.  No open lesions noted  Assessment/Plan of Care: 1. Hammertoe of left foot    Discussed clinical findings with patient today.  Patient  was fitted for several different toe pads and spacers to try.  Ultimately recommend shoes that are wide enough to accommodate the fifth toe position.  However if this tends to recur and continues to be painful, recommend surgical correction of the adductovarus toe deformity.  She would like to avoid surgery if possible  Follow-up as needed   Clerance Lav, DPM, FACFAS [facility]     [address] [place], [place] [address]                Office [phone]  Fax [phone]

## 2023-08-14 ENCOUNTER — Ambulatory Visit: Payer: PPO | Admitting: Internal Medicine

## 2023-08-14 ENCOUNTER — Encounter: Payer: Self-pay | Admitting: Internal Medicine

## 2023-08-14 VITALS — BP 122/84 | HR 67 | Temp 97.8°F | Resp 16 | Ht 62.0 in | Wt 146.4 lb

## 2023-08-14 DIAGNOSIS — E7800 Pure hypercholesterolemia, unspecified: Secondary | ICD-10-CM

## 2023-08-14 DIAGNOSIS — I2510 Atherosclerotic heart disease of native coronary artery without angina pectoris: Secondary | ICD-10-CM

## 2023-08-14 DIAGNOSIS — M545 Low back pain, unspecified: Secondary | ICD-10-CM

## 2023-08-14 DIAGNOSIS — G8929 Other chronic pain: Secondary | ICD-10-CM

## 2023-08-14 DIAGNOSIS — I10 Essential (primary) hypertension: Secondary | ICD-10-CM

## 2023-08-14 DIAGNOSIS — J4520 Mild intermittent asthma, uncomplicated: Secondary | ICD-10-CM | POA: Diagnosis not present

## 2023-08-14 DIAGNOSIS — N1831 Chronic kidney disease, stage 3a: Secondary | ICD-10-CM | POA: Diagnosis not present

## 2023-08-14 MED ORDER — CHOLESTYRAMINE 4 GM/DOSE PO POWD: 4.0000 g | Freq: Every day | ORAL | 3 refills | Status: DC

## 2023-08-14 MED ORDER — AIRSUPRA 90-80 MCG/ACT IN AERO: 2.0000 | INHALATION_SPRAY | RESPIRATORY_TRACT | 1 refills | Status: DC | PRN

## 2023-08-14 MED ORDER — CYCLOBENZAPRINE HCL 10 MG PO TABS: 10.0000 mg | ORAL_TABLET | Freq: Two times a day (BID) | ORAL | 2 refills | Status: AC | PRN

## 2023-08-14 MED ORDER — METOPROLOL TARTRATE 100 MG PO TABS: 100.0000 mg | ORAL_TABLET | Freq: Every day | ORAL | 3 refills | Status: DC

## 2023-08-14 MED ORDER — LOSARTAN POTASSIUM 50 MG PO TABS: 75.0000 mg | ORAL_TABLET | Freq: Every day | ORAL | 3 refills | Status: DC

## 2023-08-14 NOTE — Assessment & Plan Note (Signed)
Her BP is well controlled.  We will continue on her losartan and metoprolol.

## 2023-08-14 NOTE — Progress Notes (Signed)
Office Visit  Subjective   Patient ID: [name]   DOB: [birth year]   Age: 83 y.o.   MRN: [id number]   Chief Complaint Chief Complaint  Patient presents with   Follow-up    Needs medication refills     History of Present Illness The patient is a 83 year old Caucasian/White female who presents for a follow-up of her hypertension.  This past year, she has had some elevated BP's but on her last 2 visits her BP has been doing well.   The patient has been not checking her blood pressure at home. She is currently on losartan 75mg  daily and metoprolol tartrate 100mg  daily.  The patient denies any visual changes, dizziness, lightheadness, chest pain, shortness of breath, and edema. She reports there have been no other symptoms noted.     The patient also has a history of Stage IIIa CKD where we have noted there has been no change to her kidney function over the last several years from about 2019.  Her baseline creatinine ranges 0.9-1.1 with a GFR of 47-62.  She denies any NSAID use.     [name] returns today for routine followup on her cholesterol.  We noted in 2019 that her cholesterol was elevated and her ASCVD score was 20%.  We tried her on red yeast rice but this upset her stomach.  She does not want to be on a statin.  She has been on zocor, pravastatin, atorvastatin and crestor which has caused her to have mental status changes.  On her last visit, her cholesterol was elevated but again we just asked her to watch her diet and keep active.  Overall, she states she is doing well and is without any complaints or problems at this time. She specifically denies abdominal pain, nausea, vomiting, diarrhea, myalgias, and fatigue. She remains on dietary management as well as a regular exercise program and krill oil and cholestyramine.  We discussed nexlitol.  She used to be on zetia years ago. She is fasting in anticipation for labs today.   The patient has a history of mild CAD where in 2020  she was having symptoms of chest tightness where she was sent to cardiology.  She states that if she exerted herself or strained during a bowel movement, she would get chest tightness with pain that would  radiate into her left neck and down her left arm.  She did have an ECHO stress test done in 03/2011 and this showed a negative treadmill EKG and normal LVEF 55-60% with no ischemia.  She had some chest pain in 2018 and underwent a heart catherization on 08/2016.  This showed non-obstructive CAD and she had a normal LVEF of 60%.  They felt she was clear but gave her NTG to use prn.  I referred her back to cardiology where they saw her in 05/2019 and felt she was having possible angina pectoris.  She underwent a repeat heart cath in 06/2019 which showed non-obstructive CAD with 10% stenosis of the LAD. Her LVEF was 65% without wall motion abnormality.  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  Her last visit with cardiology was in 07/2019 and they felt she was doing well.    [name] also has a history of back pain with a history of lumbar and cervical stenosis.  She was followed by Dr. Phoebe Perch in neurosurgery but he has now moved and she does not see him or any other  neurosugeon anymore.  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  She has a history of lumbar laminectomy and fusion and underwent rod placement in the past of her cervical and lower spine.  She was seen by me in the past due to headaches.  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  They did a MRI of her neck and brain and this showed subcortical white matter changes but nothing acute.  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  They  discussed ESI but she did not want to do this.  I placed her back on a trial of meloxicam but she states she had side effects of nausea and stopped this.  Tylenol still controls her pain symptoms.  She also uses cyclobenzaprine as needed for pain.      Past Medical History Past Medical History:  Diagnosis Date   Arthritis    Asthma    Cervical spinal stenosis    CKD (chronic kidney disease), stage III (HCC)    Complication of anesthesia    " difficulty breathing with back surgery 2013"   GERD (gastroesophageal reflux disease)    Hypercholesterolemia    Hypertension    DR [name]  [place] [facility]ARD IN [place]   IBS (irritable bowel syndrome)    Insomnia    Lumbar stenosis    Meningioma (HCC)    Multinodular goiter    Overactive bladder    Statin intolerance    Vitamin D deficiency      Allergies Allergies  Allergen Reactions   Crestor [Rosuvastatin]    Fenofibrate    Gabapentin    Lipitor [Atorvastatin]    Lisinopril    Pravastatin    Simvastatin      Medications  Current Outpatient Medications:    acetaminophen (TYLENOL) 650 MG CR tablet, Take 1,300 mg by mouth 2 (two) times daily., Disp: , Rfl:    aspirin EC 81 MG tablet, Take 81 mg by mouth daily. , Disp: , Rfl:    cholestyramine (QUESTRAN) 4 GM/DOSE powder, Take 1 packet (4 g total) by mouth daily., Disp: 378 g, Rfl: 3   cyclobenzaprine (FLEXERIL) 10 MG tablet, Take 1 tablet (10 mg total) by mouth 2 (two) times daily as needed. For muscle spasms, Disp: 30 tablet, Rfl: 2   losartan (COZAAR) 50 MG tablet, Take 1.5 tablets (75 mg total) by mouth daily., Disp: 135 tablet, Rfl: 3   metoprolol tartrate (LOPRESSOR) 100 MG tablet, Take 1 tablet (100 mg total) by mouth daily., Disp: 90 tablet, Rfl: 3   nitroGLYCERIN (NITROSTAT) 0.4 MG SL tablet, Place 1 tablet (0.4 mg total) under the tongue every 5 (five) minutes as needed., Disp: 30 tablet, Rfl: 0   Review of Systems Review of Systems  Constitutional:  Negative for  chills, fever and malaise/fatigue.  Eyes:  Negative for blurred vision and double vision.  Respiratory:  Negative for cough and shortness of breath.   Cardiovascular:  Negative for chest pain, palpitations and leg swelling.  Gastrointestinal:  Negative for abdominal pain, constipation, diarrhea, nausea and vomiting.  Genitourinary:  Negative for frequency and hematuria.  Musculoskeletal:  Negative for myalgias.  Skin:  Negative for itching and rash.  Neurological:  Negative for dizziness, weakness and headaches.  Endo/Heme/Allergies:  Negative for polydipsia.       Objective:    Vitals BP 122/84 (BP Location: Right Arm, Patient Position: Sitting)   Pulse 67   Temp 97.8 F (36.6 C) (Temporal)   Resp 16   Ht 5\' 2"  (1.575 m)   Wt 146 lb 6 oz (66.4 kg)   SpO2 99%   BMI 26.77 kg/m    Physical Examination Physical Exam Constitutional:      Appearance: Normal appearance. She is not ill-appearing.  Cardiovascular:     Rate and Rhythm: Normal rate and regular rhythm.     Pulses: Normal pulses.     Heart sounds: No murmur heard.    No friction rub. No gallop.  Pulmonary:     Effort: Pulmonary effort is normal. No respiratory distress.     Breath sounds: No wheezing, rhonchi or rales.  Abdominal:     General: Bowel sounds are normal. There is no distension.     Palpations: Abdomen is soft.     Tenderness: There is no abdominal tenderness.  Musculoskeletal:     Right lower leg: No edema.     Left lower leg: No edema.  Skin:    General: Skin is warm and dry.     Findings: No rash.  Neurological:     Mental Status: She is alert.        Assessment & Plan:   CAD (coronary artery disease) She denies any angina at this time.  We will continue her BB, ACE-I and ASA.  Essential hypertension Her BP is well controlled.  We will continue on her losartan and metoprolol.  Mild intermittent asthma without complication She has noted that she has been using her ventolin HFA more  recently where before she rarely used it.  I am going to switch her ventolin to Airsupra for her asthma.  Stage 3a chronic kidney disease (HCC) Continue control of her HTN.  Avoid NSAIDS.  Hypercholesterolemia She will continue on krill oil and cholestyramine.    Return in about 3 months (around 11/12/2023).   Crist Fat, MD

## 2023-08-14 NOTE — Assessment & Plan Note (Signed)
She denies any angina at this time.  We will continue her BB, ACE-I and ASA.

## 2023-08-14 NOTE — Assessment & Plan Note (Signed)
Continue control of her HTN.  Avoid NSAIDS.

## 2023-08-14 NOTE — Assessment & Plan Note (Signed)
She will continue on krill oil and cholestyramine.

## 2023-08-14 NOTE — Assessment & Plan Note (Signed)
She has noted that she has been using her ventolin HFA more recently where before she rarely used it.  I am going to switch her ventolin to Airsupra for her asthma.

## 2023-11-07 ENCOUNTER — Ambulatory Visit: Payer: PPO | Admitting: Internal Medicine

## 2023-11-07 ENCOUNTER — Encounter: Payer: Self-pay | Admitting: Internal Medicine

## 2023-11-07 VITALS — BP 134/86 | HR 74 | Temp 98.6°F | Resp 17 | Ht 62.0 in | Wt 148.6 lb

## 2023-11-07 DIAGNOSIS — M25512 Pain in left shoulder: Secondary | ICD-10-CM | POA: Insufficient documentation

## 2023-11-07 DIAGNOSIS — J4520 Mild intermittent asthma, uncomplicated: Secondary | ICD-10-CM

## 2023-11-07 DIAGNOSIS — R202 Paresthesia of skin: Secondary | ICD-10-CM | POA: Diagnosis not present

## 2023-11-07 DIAGNOSIS — I10 Essential (primary) hypertension: Secondary | ICD-10-CM

## 2023-11-07 DIAGNOSIS — R200 Anesthesia of skin: Secondary | ICD-10-CM | POA: Diagnosis not present

## 2023-11-07 MED ORDER — ALBUTEROL SULFATE HFA 108 (90 BASE) MCG/ACT IN AERS: 2.0000 | INHALATION_SPRAY | Freq: Four times a day (QID) | RESPIRATORY_TRACT | 2 refills | Status: AC | PRN

## 2023-11-07 MED ORDER — METHYLPREDNISOLONE 4 MG PO TBPK: ORAL_TABLET | ORAL | 0 refills | Status: DC

## 2023-11-07 NOTE — Assessment & Plan Note (Signed)
 She has a history of cervical stenosis and foraminal stenosis.  I bet this is the cause of her numbness and tingling.  There is no weakness in either hand or arm.  I will check some labs at this time.  If it does not improve, she will need to go back to neurosurgery.

## 2023-11-07 NOTE — Assessment & Plan Note (Signed)
 Her BP is doing well.  WE will continue her current medications.

## 2023-11-07 NOTE — Assessment & Plan Note (Signed)
 She has pain in her lateral posterior shoulder on the left.  I wonder if this is arthritis.  We will get a xray of her shoulder.  We will start her on a medrol dose pak.

## 2023-11-07 NOTE — Assessment & Plan Note (Signed)
 Her asthma is doing well but she wants to go back on ventolin  prn.

## 2023-11-07 NOTE — Progress Notes (Signed)
 Office Visit  Subjective   Patient ID: [name]   DOB: March 22, 1941   Age: 83 y.o.   MRN: [id number]   Chief Complaint Chief Complaint  Patient presents with   Follow-up     History of Present Illness The patient returns also for followup of her mild asthma of years duration and presents today for a status visit.  This was diagnosed as adult onset asthma. She uses her inhaler intermittently where she may use it every day or could go 2-3 weeks without using it.  On her last visit, we did switch her to airsupra  from ventolin  but over the interim, she does not like this as she has arthritis and it is difficult to administer.   There is no history of allergies.  See PMH for summary of pulmonary history and lab reports for status of any previous PFTs: Asthma. Comorbid conditions : none. She has no baseline symptoms of asthma. The patient's condition is controlled by medications as summarized in the medication list on the face sheet. She has no modifiable risk factors. Specifically denied complaints: worsening exertional dyspnea, increased wheezing, productive cough, fever, and nocturnal wheezing.   The patient also has complaints of numbness and tingling of her left arm/hand that started 2 weeks ago.  She has also noted some pain in her left shoulder that is a throbbing pain that started last night.  She has not taken anything for this pain.  [name] also has a history of back pain with a history of lumbar and cervical stenosis.  She was followed by Dr. Susen Epstein in neurosurgery but he has now moved and she does not see him or any other neurosugeon anymore.  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  She has a history of lumbar laminectomy and fusion and underwent rod placement in the past of her cervical and lower spine.  She was seen by me in the past due to headaches.  Her opthalamogist felt she was having ocular migraines but she had diplopia and I sent her for CT  of her head in the past which showed no acute abnormality.  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  They did a MRI of her neck and brain and this showed subcortical white matter changes but nothing acute.  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  They discussed ESI but she did not want to do this.  I placed her back on a trial of meloxicam but she states she had side effects of nausea and stopped this.  She also uses cyclobenzaprine  as needed for pain.  The patient is a 83 year old Caucasian/White female who presents for a follow-up of her hypertension.  This past year, she has had some elevated BP's but on her last 2 visits her BP has been doing well.   The patient has been checking her blood pressure at home.  Her BP has been running 130-140's at home.  She is currently on losartan  75mg  daily and metoprolol  tartrate 100mg  daily.  The patient denies any visual changes, dizziness, lightheadness, chest pain, shortness of breath, and edema. She reports there have been no other symptoms noted.      Past Medical History Past Medical History:  Diagnosis Date   Arthritis    Asthma    Cervical spinal stenosis    CKD (chronic kidney  disease), stage III (HCC)    Complication of anesthesia    " difficulty breathing with back surgery 2013"   GERD (gastroesophageal reflux disease)    Hypercholesterolemia    Hypertension    DR [name]  [place] [facility] IN [place]   IBS (irritable bowel syndrome)    Insomnia    Lumbar stenosis    Meningioma (HCC)    Multinodular goiter    Overactive bladder    Statin intolerance    Vitamin D  deficiency      Allergies Allergies  Allergen Reactions   Crestor [Rosuvastatin]    Fenofibrate    Gabapentin    Lipitor [Atorvastatin]    Lisinopril    Pravastatin    Simvastatin      Medications  Current Outpatient Medications:    acetaminophen   (TYLENOL ) 650 MG CR tablet, Take 1,300 mg by mouth 2 (two) times daily., Disp: , Rfl:    aspirin  EC 81 MG tablet, Take 81 mg by mouth daily. , Disp: , Rfl:    cholestyramine  (QUESTRAN ) 4 GM/DOSE powder, Take 1 packet (4 g total) by mouth daily., Disp: 90 packet, Rfl: 3   cyclobenzaprine  (FLEXERIL ) 10 MG tablet, Take 1 tablet (10 mg total) by mouth 2 (two) times daily as needed. For muscle spasms, Disp: 30 tablet, Rfl: 2   losartan  (COZAAR ) 50 MG tablet, Take 1.5 tablets (75 mg total) by mouth daily., Disp: 135 tablet, Rfl: 3   metoprolol  tartrate (LOPRESSOR ) 100 MG tablet, Take 1 tablet (100 mg total) by mouth daily., Disp: 90 tablet, Rfl: 3   nitroGLYCERIN  (NITROSTAT ) 0.4 MG SL tablet, Place 1 tablet (0.4 mg total) under the tongue every 5 (five) minutes as needed., Disp: 30 tablet, Rfl: 0   Review of Systems Review of Systems  Constitutional:  Negative for chills and fever.  Eyes:  Negative for blurred vision and double vision.  Respiratory:  Negative for cough, shortness of breath and wheezing.   Cardiovascular:  Negative for chest pain, palpitations and leg swelling.  Gastrointestinal:  Negative for abdominal pain, constipation, diarrhea, nausea and vomiting.  Genitourinary:  Negative for frequency.  Musculoskeletal:  Positive for back pain and neck pain. Negative for myalgias.  Skin:  Negative for itching and rash.  Neurological:  Negative for dizziness, weakness and headaches.  Endo/Heme/Allergies:  Negative for polydipsia.       Objective:    Vitals BP 134/86   Pulse 74   Temp 98.6 F (37 C)   Resp 17   Ht 5\' 2"  (1.575 m)   Wt 148 lb 9.6 oz (67.4 kg)   SpO2 98%   BMI 27.18 kg/m    Physical Examination Physical Exam Constitutional:      Appearance: Normal appearance. She is not ill-appearing.  Cardiovascular:     Rate and Rhythm: Normal rate and regular rhythm.     Pulses: Normal pulses.     Heart sounds: No murmur heard.    No friction rub. No gallop.  Pulmonary:      Effort: Pulmonary effort is normal. No respiratory distress.     Breath sounds: No wheezing, rhonchi or rales.  Abdominal:     General: Bowel sounds are normal. There is no distension.     Palpations: Abdomen is soft.     Tenderness: There is no abdominal tenderness.  Musculoskeletal:     Right lower leg: No edema.     Left lower leg: No edema.     Comments: Normal ROM of left shoulder.  She has pain on palpation of the lateral posterior left shoudler.  Her strength is 5/5 thru out her bilateral arms and hands.  Skin:    General: Skin is warm and dry.     Findings: No rash.  Neurological:     General: No focal deficit present.     Mental Status: She is alert and oriented to person, place, and time.  Psychiatric:        Mood and Affect: Mood normal.        Behavior: Behavior normal.        Assessment & Plan:   Essential hypertension Her BP is doing well.  WE will continue her current medications.  Mild intermittent asthma without complication Her asthma is doing well but she wants to go back on ventolin  prn.  Numbness and tingling in left arm She has a history of cervical stenosis and foraminal stenosis.  I bet this is the cause of her numbness and tingling.  There is no weakness in either hand or arm.  I will check some labs at this time.  If it does not improve, she will need to go back to neurosurgery.  Acute pain of left shoulder She has pain in her lateral posterior shoulder on the left.  I wonder if this is arthritis.  We will get a xray of her shoulder.  We will start her on a medrol dose pak.    Return in about 3 months (around 02/16/2024) for annual.   Jari Dipasquale Van Eyk, MD

## 2023-11-08 LAB — CMP14 + ANION GAP
ALT: 17 IU/L (ref 0–32)
AST: 21 IU/L (ref 0–40)
Albumin: 5.2 g/dL — ABNORMAL HIGH (ref 3.7–4.7)
Alkaline Phosphatase: 75 IU/L (ref 44–121)
Anion Gap: 14 mmol/L (ref 10.0–18.0)
BUN/Creatinine Ratio: 24 (ref 12–28)
BUN: 21 mg/dL (ref 8–27)
Bilirubin Total: 0.5 mg/dL (ref 0.0–1.2)
CO2: 24 mmol/L (ref 20–29)
Calcium: 10.5 mg/dL — ABNORMAL HIGH (ref 8.7–10.3)
Chloride: 101 mmol/L (ref 96–106)
Creatinine, Ser: 0.88 mg/dL (ref 0.57–1.00)
Globulin, Total: 2.2 g/dL (ref 1.5–4.5)
Glucose: 85 mg/dL (ref 70–99)
Potassium: 5 mmol/L (ref 3.5–5.2)
Sodium: 139 mmol/L (ref 134–144)
Total Protein: 7.4 g/dL (ref 6.0–8.5)
eGFR: 66 mL/min/{1.73_m2} (ref >59)

## 2023-11-08 LAB — TSH: TSH: 0.813 u[IU]/mL (ref 0.450–4.500)

## 2023-11-08 LAB — VITAMIN B12: Vitamin B-12: 784 pg/mL (ref 232–1245)

## 2023-11-10 DIAGNOSIS — M19012 Primary osteoarthritis, left shoulder: Secondary | ICD-10-CM | POA: Diagnosis not present

## 2023-11-11 NOTE — Progress Notes (Signed)
 Her albumin and calcium are elevated. I want her to come in for a serum protein electrophoresis.  Pt aware and scheduled for blood work

## 2023-11-12 ENCOUNTER — Encounter: Payer: Self-pay | Admitting: Internal Medicine

## 2023-11-13 ENCOUNTER — Ambulatory Visit

## 2023-11-13 DIAGNOSIS — R8290 Unspecified abnormal findings in urine: Secondary | ICD-10-CM

## 2023-11-13 NOTE — Progress Notes (Signed)
Lab Orders

## 2023-11-17 ENCOUNTER — Ambulatory Visit: Admitting: Internal Medicine

## 2023-11-17 DIAGNOSIS — L570 Actinic keratosis: Secondary | ICD-10-CM | POA: Diagnosis not present

## 2023-11-17 DIAGNOSIS — L821 Other seborrheic keratosis: Secondary | ICD-10-CM | POA: Diagnosis not present

## 2023-11-17 DIAGNOSIS — L578 Other skin changes due to chronic exposure to nonionizing radiation: Secondary | ICD-10-CM | POA: Diagnosis not present

## 2023-11-17 DIAGNOSIS — L820 Inflamed seborrheic keratosis: Secondary | ICD-10-CM | POA: Diagnosis not present

## 2023-11-17 NOTE — Progress Notes (Unsigned)
 Nurse visit Return urine 24 hours

## 2023-11-19 ENCOUNTER — Other Ambulatory Visit: Payer: Self-pay

## 2023-11-19 LAB — PROTEIN ELECTROPHORESIS, SERUM, WITH REFLEX

## 2023-11-26 ENCOUNTER — Ambulatory Visit: Admitting: Internal Medicine

## 2023-11-26 DIAGNOSIS — R899 Unspecified abnormal finding in specimens from other organs, systems and tissues: Secondary | ICD-10-CM

## 2023-11-26 NOTE — Progress Notes (Signed)
 Nurse visit  Blood draw

## 2023-11-28 ENCOUNTER — Ambulatory Visit: Payer: Self-pay

## 2023-11-28 NOTE — Progress Notes (Signed)
 Why was serum protein electorphoresis cancelled? She needs to do this. Was there a reason [facility] cancelled?  Pt aware. Wrong test was done by Alisa App Pt scheduled to come in

## 2023-12-04 LAB — PROTEIN ELECTROPHORESIS, SERUM, WITH REFLEX
A/G Ratio: 1.1 (ref 0.7–1.7)
Albumin ELP: 3.5 g/dL (ref 2.9–4.4)
Alpha 1: 0.3 g/dL (ref 0.0–0.4)
Alpha 2: 0.7 g/dL (ref 0.4–1.0)
Beta: 1.4 g/dL — ABNORMAL HIGH (ref 0.7–1.3)
Gamma Globulin: 0.8 g/dL (ref 0.4–1.8)
Globulin, Total: 3.3 g/dL (ref 2.2–3.9)
Total Protein: 6.8 g/dL (ref 6.0–8.5)

## 2024-02-12 ENCOUNTER — Ambulatory Visit: Admitting: Podiatry

## 2024-02-12 DIAGNOSIS — L84 Corns and callosities: Secondary | ICD-10-CM

## 2024-02-12 DIAGNOSIS — M2042 Other hammer toe(s) (acquired), left foot: Secondary | ICD-10-CM

## 2024-02-12 DIAGNOSIS — M71371 Other bursal cyst, right ankle and foot: Secondary | ICD-10-CM | POA: Diagnosis not present

## 2024-02-12 DIAGNOSIS — M71372 Other bursal cyst, left ankle and foot: Secondary | ICD-10-CM

## 2024-02-12 NOTE — Progress Notes (Signed)
 Chief Complaint  Patient presents with   Injections    She has bilateral 5th callous, she got injections last time and would like repeat injections today. It is painful for sheets to touch her foot. She is not diabetic and no anti coag.    HPI: 83 y.o. female presents today requesting cortisone injections in her fifth toes.  She states she got significant relief after the last injection.  She notes the corn on the right fifth toe is fairly sore.  Past Medical History:  Diagnosis Date   Arthritis    Asthma    Cervical spinal stenosis    CKD (chronic kidney disease), stage III (HCC)    Complication of anesthesia     difficulty breathing with back surgery 2013   GERD (gastroesophageal reflux disease)    Hypercholesterolemia    Hypertension    DR [name]  [place] [facility] IN [place]   IBS (irritable bowel syndrome)    Insomnia    Lumbar stenosis    Meningioma (HCC)    Multinodular goiter    Overactive bladder    Statin intolerance    Vitamin D  deficiency    Past Surgical History:  Procedure Laterality Date   ABDOMINAL HYSTERECTOMY     1996   BACK SURGERY     2010   CARDIAC CATHETERIZATION     1998  HIGH PT   CERVICAL DISC SURGERY     12/2008   CHOLECYSTECTOMY     LEFT HEART CATH AND CORONARY ANGIOGRAPHY N/A 06/28/2019   Procedure: LEFT HEART CATH AND CORONARY ANGIOGRAPHY;  Surgeon: Verlin Lonni BIRCH, MD;  Location: [facility];  Service: Cardiovascular;  Laterality: N/A;   LUMBAR FUSION     Allergies  Allergen Reactions   Crestor [Rosuvastatin]    Fenofibrate    Gabapentin    Lipitor [Atorvastatin]    Lisinopril    Pravastatin    Simvastatin     Physical Exam: Palpable pedal pulses noted.  Bilateral fifth toes have adducted and varus position with prominent bone at the PIP joint.  There is pain on palpation at the PIPJ along the dorsal lateral aspect.  There is a hyperkeratotic lesion seen on the right fifth toe at the PIP joint.  No ulceration is  noted.  Minimal localized edema is noted.  No calor noted.  Assessment/Plan of Care: 1. Other bursal cyst, left ankle and foot   2. Hammertoe of left foot   3. Corns     With the patient's verbal consent, a corticosteroid injection was adminstered to the bilateral fifth toe PIPJ using a dorsal lateral approach..  This consisted of a mixture of 1% lidocaine  plain, 0.5% sensorcaine plain, and Kenalog-10 for a total of 1 cc administered to each fifth toe.  Bandaid applied. Patient tolerated this well.     After the injections were administered, the right fifth toe corn was shaved at the PIPJ level.  Discussed shoe gear and need to decrease pressure on the fifth toes.  Follow-up as needed   Awanda CHARM [place], DPM, FACFAS [facility]     [address] [place] [address]                Office [phone]  Fax [phone]

## 2024-02-18 ENCOUNTER — Encounter: Admitting: Internal Medicine

## 2024-02-20 ENCOUNTER — Encounter: Admitting: Internal Medicine

## 2024-03-17 ENCOUNTER — Ambulatory Visit: Admitting: Internal Medicine

## 2024-03-17 ENCOUNTER — Encounter: Payer: Self-pay | Admitting: Internal Medicine

## 2024-03-17 VITALS — BP 138/92 | HR 73 | Temp 97.6°F | Resp 16 | Ht 62.0 in | Wt 142.0 lb

## 2024-03-17 DIAGNOSIS — M545 Low back pain, unspecified: Secondary | ICD-10-CM | POA: Diagnosis not present

## 2024-03-17 DIAGNOSIS — K219 Gastro-esophageal reflux disease without esophagitis: Secondary | ICD-10-CM

## 2024-03-17 DIAGNOSIS — N3281 Overactive bladder: Secondary | ICD-10-CM

## 2024-03-17 DIAGNOSIS — Z0000 Encounter for general adult medical examination without abnormal findings: Secondary | ICD-10-CM

## 2024-03-17 DIAGNOSIS — J4520 Mild intermittent asthma, uncomplicated: Secondary | ICD-10-CM | POA: Diagnosis not present

## 2024-03-17 DIAGNOSIS — D329 Benign neoplasm of meninges, unspecified: Secondary | ICD-10-CM

## 2024-03-17 DIAGNOSIS — N1831 Chronic kidney disease, stage 3a: Secondary | ICD-10-CM

## 2024-03-17 DIAGNOSIS — M48061 Spinal stenosis, lumbar region without neurogenic claudication: Secondary | ICD-10-CM

## 2024-03-17 DIAGNOSIS — E7800 Pure hypercholesterolemia, unspecified: Secondary | ICD-10-CM

## 2024-03-17 DIAGNOSIS — M4802 Spinal stenosis, cervical region: Secondary | ICD-10-CM | POA: Diagnosis not present

## 2024-03-17 DIAGNOSIS — I2510 Atherosclerotic heart disease of native coronary artery without angina pectoris: Secondary | ICD-10-CM | POA: Diagnosis not present

## 2024-03-17 DIAGNOSIS — Z1231 Encounter for screening mammogram for malignant neoplasm of breast: Secondary | ICD-10-CM

## 2024-03-17 DIAGNOSIS — R739 Hyperglycemia, unspecified: Secondary | ICD-10-CM

## 2024-03-17 DIAGNOSIS — G8929 Other chronic pain: Secondary | ICD-10-CM

## 2024-03-17 DIAGNOSIS — I10 Essential (primary) hypertension: Secondary | ICD-10-CM

## 2024-03-17 DIAGNOSIS — Z6825 Body mass index (BMI) 25.0-25.9, adult: Secondary | ICD-10-CM | POA: Insufficient documentation

## 2024-03-17 DIAGNOSIS — G4700 Insomnia, unspecified: Secondary | ICD-10-CM

## 2024-03-17 DIAGNOSIS — Z789 Other specified health status: Secondary | ICD-10-CM

## 2024-03-17 DIAGNOSIS — K589 Irritable bowel syndrome without diarrhea: Secondary | ICD-10-CM

## 2024-03-17 DIAGNOSIS — E042 Nontoxic multinodular goiter: Secondary | ICD-10-CM | POA: Diagnosis not present

## 2024-03-17 DIAGNOSIS — M150 Primary generalized (osteo)arthritis: Secondary | ICD-10-CM

## 2024-03-17 NOTE — Assessment & Plan Note (Signed)
 She uses extra strength tylenol  as needed.

## 2024-03-17 NOTE — Assessment & Plan Note (Signed)
 Continue to eat healthy, contnue to be active with exercise.

## 2024-03-17 NOTE — Assessment & Plan Note (Signed)
 We will continue to monitor.

## 2024-03-17 NOTE — Assessment & Plan Note (Signed)
 She is not on any PPI and denies any reflux or dysphagia.

## 2024-03-17 NOTE — Progress Notes (Signed)
 Preventive Screening-Counseling & Management    [name] is a 83 year old Caucasian/White female who presents for her annual wellness exam. She is due for the following health maintenance studies: mammogram, visual exam, and screening labs. This patient's past medical history Asthma, Basal Cell Carcinoma, GERD, Hyperlipidemia, Hypertension, Benign Essential, Irritable Bowel Syndrome, Menopausal Syndrome, Osteoarthritis, and Vitamin D  deficiency.   Her last eye exam was done in 05/2023 where optometry is following her for cataracts and a history of ocular migraines. She went for cataract surgery 05/2016 and another in 07/2016.  She states her vision is doing well today.  Her last mammogram was done in 02/18/2023 and was normal. Her last colonoscopy was 12/2007 and this demonstrated diverticulosis. They wanted to repeat this again in 2019 but the patient refused. She also refuses a cologuard and FIT test in the past. She does not want a repeat bone density but she tells me that she had a bone density around 2011 and it was normal. She exercises by riding a recumbent bike 4-5 days a week for 10 miles per day for exercise. She has never smoked .  She does do yearly flu vaccines. She did have a pneumovax 23 vaccine near the age of 31 and again in 08/2019. She has had a zostavax vaccine in the past. She had a prevnar 13 vaccine done in 04/2019. She had a shingrix vaccine in 04/2019. She has had 3 COVID-19 vaccines including 1 booster. There is no depression or anxiety. The patient is on an ASA 81mg  daily.   The patient returns also for followup of her mild asthma of years duration and presents today for a status visit.  This was diagnosed as adult onset asthma. She uses her inhaler intermittently where she may use it every day or could go 2-3 weeks without using it.  On her last visit, we did switch her to airsupra  from ventolin  but over the interim, she does not like this as she has arthritis and it is  difficult to administer.   There is no history of allergies.  See PMH for summary of pulmonary history and lab reports for status of any previous PFTs: Asthma. Comorbid conditions : none. She has no baseline symptoms of asthma. The patient's condition is controlled by medications as summarized in the medication list on the face sheet. She has no modifiable risk factors. Specifically denied complaints: worsening exertional dyspnea, increased wheezing, productive cough, fever, and nocturnal wheezing.    She has a history of cervical stenosis and foraminal stenosis.  The patient saw me in 10/2023 with complaints of numbness and tingling of her left arm/hand that started at that time.  This is still very minor and she denies any weakness.   She has also noted some pain in her left shoulder that is a throbbing pain that started last night.  She has not taken anything for this pain.  [name] also has a history of back pain with a history of lumbar and cervical stenosis.  She was followed by Dr. Amon in neurosurgery but he has now moved and she does not see him or any other neurosugeon anymore.  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  She has a history of lumbar laminectomy and fusion and underwent rod placement in the past of her cervical and lower spine.  She was seen by me in the past due to headaches.  Her opthalamogist felt she was having ocular migraines  but she had diplopia and I sent her for CT of her head in the past which showed no acute abnormality.  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  They did a MRI of her neck and brain and this showed subcortical white matter changes but nothing acute.  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  They discussed ESI but she did not want to do this.  I placed her back on a trial of meloxicam but she states she had side  effects of nausea and stopped this.  She also uses cyclobenzaprine  as needed for pain.   The patient is a 83 year old Caucasian/White female who presents for a follow-up of her hypertension.  This past year, she has had some elevated BP's .   The patient has not been checking her blood pressure at home.  She is currently on losartan  75mg  daily and metoprolol  tartrate 100mg  daily.  The patient denies any visual changes, dizziness, lightheadness, chest pain, shortness of breath, and edema. She reports there have been no other symptoms noted.  The patient also has a history of Stage IIIa CKD where we have noted there has been no change to her kidney function over the last several years from about 2019.  Her baseline creatinine ranges 0.9-1.1 with a GFR of 47-62.  She denies any NSAID use.     [name] returns today for routine followup on her cholesterol.  We noted in 2019 that her cholesterol was elevated and her ASCVD score was 20%.  We tried her on red yeast rice but this upset her stomach.  She does not want to be on a statin.  She has been on zocor, pravastatin, atorvastatin and crestor which has caused her to have mental status changes.  On her last visit, her cholesterol was elevated but again we just asked her to watch her diet and keep active.  Overall, she states she is doing well and is without any complaints or problems at this time. She specifically denies abdominal pain, nausea, vomiting, diarrhea, myalgias, and fatigue. She remains on dietary management as well as a regular exercise program and krill oil and cholestyramine .  We discussed nexlitol.  She used to be on zetia  years ago. She is fasting in anticipation for labs today.    The patient has a history of mild CAD where in 2020 she was having symptoms of chest tightness where she was sent to cardiology.  She states that if she exerted herself or strained during a bowel movement, she would get chest tightness with pain that would   radiate into her left neck and down her left arm.  She did have an ECHO stress test done in 03/2011 and this showed a negative treadmill EKG and normal LVEF 55-60% with no ischemia.  She had some chest pain in 2018 and underwent a heart catherization on 08/2016.  This showed non-obstructive CAD and she had a normal LVEF of 60%.  They felt she was clear but gave her NTG to use prn.  I referred her back to cardiology where they saw her in 05/2019 and felt she was having possible angina pectoris.  She underwent a repeat heart cath in 06/2019 which showed non-obstructive CAD with 10% stenosis of the LAD. Her LVEF was 65% without wall motion abnormality.  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  Her  last visit with cardiology was in 07/2019 and they felt she was doing well.      [name] returns for followup of overactive bladder with urinary frequency.   I wanted to start her on myrbetriq but she is not interested in taking a medication.   I also wanted to refer her to urology but she does not want to do this yet.  She has tried a trial of gemtesa but she states this did not help.  There is no f/c, n/v, low back pain, abdominal pain, dysuria, hematuria or other problems.  She does states she get up about 4 times at night to urinate.  However during the day she may urinate 17 times.  She states she does not feel like she empties her bladder where she will urinate, stop and then go again.  She denies any leakage with she coughs or sneezes.  She wants to just follow this and hold off on referral to a urologist.   On her yearly exam in 2023, she was stating she was having problems with both short and long term memory.  She also has had a fall where she states it feels like she does not pick up her feet.  The patient uses a one point cane.   She denied any generalized weakness but states she will just be walking and would fall.  There has been no LOC and no major injuries.  We did lab testing for  dementia and her labs were normal.  I sent her for a CT scan of her brain which was done on 10/09/2021 and this showed no acute abnormality.  There was chronic microvascular ischemic changes but she had a hyperdense dural based lesion over the partial lobe on the right.  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  Due to her falls, I wanted to refer her to neurosurgery but she refused at that time.  Today, she denies any falls and she states she has no more problems with her memory.  She scored a 30/30 on the MMSE on 02/13/2023 and today on 03/17/2024 she score a 30/30.   [name] also has a history of IBS where she is followed by GI.  She has a predominantly diarrhea component of her IBS.  She did see GI in 09/2020 and they started her on a trial of questran .  She states this has helped alot and her IBS is essentially resolved.  She can have abdominal pain at times and she does take her dicyclomine  as needed.  She denies any problems with IBS today.   She also has a history of insomnia which has been going on for years.  There has been no change in her insomnia for the last year.   She did try a trial of melatonin and self titrated appropriately but it did not help her sleep.   She wonders if pain is keeping her up where she has arthritic pain in her hands, shoulders, and hips.  Her hands and hips are the worse where she has continuous dull aching pain that can worsen at times.   She is not snoring and is not gasping when she breaths.  I did place her on Celebrex in 11/2019 and she states this has helped.  The patient no longer takes Celebrex as she states  she did not see anything effect.  She takes 2 extra strength Tylenol  and that seems to help.     The patient returns for history of anxiety. This remains resolved and was situational and was related to  when her mother was going into a nursing home. This has not recurred. She reports no additional symptoms She denies fatigue, weight loss, insomnia, out of control feelings, and panic attacks. This patient feels that she is able to care for herself. She currently lives with her husband. She has no significant prior history of mental health disorders.  She denies any depression or anxiety today.   She did have a basal cell carcinoma removed from her chin, eyelid, ear and cheek and is followed every 6 months by dermatology.     She does have a history of reflux where she was on prilosec which had stopped her reflux.  She tells me today that she has stopped the prilosec and she denies any symptoms today.     She also received a workup for goiter in the past where she underwent thyroid  ultraound and she did have FNA of her enlarged thyroid  which was benign.  Ultimately, she was found to have multinodular goiter.  We did repeat a thyroid  US  on 02/2018.  This showed multiple nodules which were stable and did not need biopsy. She denies any symptoms.        Are there smokers in your home (other than you)? No  Risk Factors Current exercise habits: as above  Dietary issues discussed: none   Depression Screen (Note: if answer to either of the following is Yes, a more complete depression screening is indicated)   Over the past two weeks, have you felt down, depressed or hopeless? No  Over the past two weeks, have you felt little interest or pleasure in doing things? No  Have you lost interest or pleasure in daily life? No  Do you often feel hopeless? No  Do you cry easily over simple problems? No  Activities of Daily Living In your present state of health, do you have any difficulty performing the following activities?:  Driving? No Managing money?  No Feeding yourself? No Getting from bed to chair? No Climbing a flight of stairs? No Preparing food and eating?: No Bathing or showering?  No Getting dressed: No Getting to the toilet? No Using the toilet:No Moving around from place to place: No In the past year have you fallen or had a near fall?:No    Hearing Difficulties: No Do you often ask people to speak up or repeat themselves? No Do you experience ringing or noises in your ears? Yes Do you have difficulty understanding soft or whispered voices? No   Do you feel that you have a problem with memory? No  Do you often misplace items? No  Do you feel safe at home?  Yes  Cognitive Testing  Alert? Yes  Normal Appearance?Yes  Oriented to person? Yes  Place? Yes   Time? Yes  Recall of three objects?  Yes  Can perform simple calculations? Yes  Displays appropriate judgment?Yes  Can read the correct time from a watch face?Yes  Fall Risk Prevention  Any stairs in or around the home? No  If so, are there any without handrails? No  Home free of loose throw rugs in walkways, pet beds, electrical cords, etc? Yes  Adequate lighting in your home to reduce risk of falls? Yes  Use of a cane, walker or w/c? Yes -  she uses a cane to ambulate   Time Up and Go  Was the test performed? Yes .  Length of time to ambulate 10 feet: 14 sec.   Gait slow and steady with assistive device    Advanced Directives have been discussed with the patient? Yes   List the Names of Other Physician/Practitioners you currently use: Patient Care Team: Fleeta Valeria Mayo, MD as PCP - General (Internal Medicine)    Past Medical History:  Diagnosis Date   Arthritis    Asthma    Cervical spinal stenosis    CKD (chronic kidney disease), stage III (HCC)    Complication of anesthesia     difficulty breathing with back surgery 2013   GERD (gastroesophageal reflux disease)    Hypercholesterolemia    Hypertension    DR [name]   CARD IN    IBS (irritable bowel syndrome)    Insomnia    Lumbar stenosis    Meningioma (HCC)    Multinodular goiter    Overactive bladder     Statin intolerance    Vitamin D  deficiency     Past Surgical History:  Procedure Laterality Date   ABDOMINAL HYSTERECTOMY     1996   BACK SURGERY     2010   CARDIAC CATHETERIZATION     1998  HIGH PT   CERVICAL DISC SURGERY     12/2008   CHOLECYSTECTOMY     LEFT HEART CATH AND CORONARY ANGIOGRAPHY N/A 06/28/2019   Procedure: LEFT HEART CATH AND CORONARY ANGIOGRAPHY;  Surgeon: Verlin Lonni BIRCH, MD;  Location: [facility];  Service: Cardiovascular;  Laterality: N/A;   LUMBAR FUSION        Current Medications  Current Outpatient Medications  Medication Sig Dispense Refill   acetaminophen  (TYLENOL ) 650 MG CR tablet Take 1,300 mg by mouth 2 (two) times daily.     albuterol  (VENTOLIN  HFA) 108 (90 Base) MCG/ACT inhaler Inhale 2 puffs into the lungs every 6 (six) hours as needed for wheezing or shortness of breath. 8 g 2   aspirin  EC 81 MG tablet Take 81 mg by mouth daily.      cholestyramine  (QUESTRAN ) 4 GM/DOSE powder Take 1 packet (4 g total) by mouth daily. 90 packet 3   cyclobenzaprine  (FLEXERIL ) 10 MG tablet Take 1 tablet (10 mg total) by mouth 2 (two) times daily as needed. For muscle spasms 30 tablet 2   losartan  (COZAAR ) 50 MG tablet Take 1.5 tablets (75 mg total) by mouth daily. 135 tablet 3   metoprolol  tartrate (LOPRESSOR ) 100 MG tablet Take 1 tablet (100 mg total) by mouth daily. 90 tablet 3   nitroGLYCERIN  (NITROSTAT ) 0.4 MG SL tablet Place 1 tablet (0.4 mg total) under the tongue every 5 (five) minutes as needed. 30 tablet 0   No current facility-administered medications for this visit.    Allergies Crestor [rosuvastatin], Fenofibrate, Gabapentin, Lipitor [atorvastatin], Lisinopril, Pravastatin, and Simvastatin   Social History Social History   Tobacco Use   Smoking status: Never   Smokeless tobacco: Never  Substance Use Topics   Alcohol  use: Yes    Comment: OCC     Review of Systems Review of Systems  Constitutional:  Negative for chills, fever  and malaise/fatigue.  Eyes:  Negative for blurred vision and double vision.  Respiratory:  Negative for cough, hemoptysis, shortness of breath and wheezing.   Cardiovascular:  Negative for chest pain, palpitations and leg swelling.  Gastrointestinal:  Negative for abdominal pain, blood in  stool, constipation, diarrhea, heartburn, melena, nausea and vomiting.  Genitourinary:  Positive for frequency. Negative for hematuria.  Musculoskeletal:  Negative for myalgias.  Skin:  Negative for itching and rash.  Neurological:  Negative for dizziness, weakness and headaches.  Endo/Heme/Allergies:  Negative for polydipsia.     Physical Exam:      Body mass index is 25.97 kg/m. BP (!) 138/92   Pulse 73   Temp 97.6 F (36.4 C) (Temporal)   Resp 16   Ht 5' 2 (1.575 m)   Wt 142 lb (64.4 kg) Comment: Patient reported  SpO2 99%   BMI 25.97 kg/m   Physical Exam Constitutional:      Appearance: Normal appearance. She is not ill-appearing.  HENT:     Head: Normocephalic and atraumatic.     Right Ear: Tympanic membrane, ear canal and external ear normal.     Left Ear: Tympanic membrane, ear canal and external ear normal.     Nose: Nose normal. No congestion or rhinorrhea.     Mouth/Throat:     Mouth: Mucous membranes are moist.     Pharynx: Oropharynx is clear. No posterior oropharyngeal erythema.  Eyes:     General: No scleral icterus.    Conjunctiva/sclera: Conjunctivae normal.     Pupils: Pupils are equal, round, and reactive to light.  Neck:     Thyroid : No thyromegaly.     Vascular: No carotid bruit.  Cardiovascular:     Rate and Rhythm: Normal rate and regular rhythm.     Pulses: Normal pulses.     Heart sounds: Normal heart sounds. No murmur heard.    No friction rub. No gallop.  Pulmonary:     Effort: Pulmonary effort is normal. No respiratory distress.     Breath sounds: Normal breath sounds. No wheezing, rhonchi or rales.  Abdominal:     General: Abdomen is flat. Bowel  sounds are normal. There is no distension.     Palpations: Abdomen is soft.     Tenderness: There is no abdominal tenderness.  Musculoskeletal:     Cervical back: Normal range of motion. No tenderness.     Right lower leg: No edema.     Left lower leg: No edema.     Comments: No clubbing or cyanosis  Lymphadenopathy:     Cervical: No cervical adenopathy.  Skin:    General: Skin is warm and dry.     Findings: No rash.  Neurological:     General: No focal deficit present.     Mental Status: She is alert and oriented to person, place, and time.     Comments: CN II-XII grossly intact  Psychiatric:        Mood and Affect: Mood normal.        Behavior: Behavior normal.      Assessment:      Essential hypertension  Cervical spinal stenosis  Mild intermittent asthma without complication  Stage 3a chronic kidney disease (HCC)  Hypercholesterolemia  Coronary artery disease involving native coronary artery of native heart without angina pectoris  Chronic low back pain without sciatica, unspecified back pain laterality  Overactive bladder  Meningioma (HCC)  Gastroesophageal reflux disease, unspecified whether esophagitis present  Multinodular goiter  Insomnia, unspecified type  Irritable bowel syndrome, unspecified type  Statin intolerance  Primary osteoarthritis involving multiple joints  BMI 25.0-25.9,adult  Spinal stenosis of lumbar region without neurogenic claudication  Hyperglycemia  Screening mammogram for breast cancer    Plan:  During the course of the visit the patient was educated and counseled about appropriate screening and preventive services including:   Pneumococcal vaccine  Influenza vaccine Screening mammography Bone densitometry screening Colorectal cancer screening Advanced directives: discussed  Diet review for nutrition referral? Yes ____  Not Indicated _X___   Patient Instructions (the written plan) was given to the  patient.  Essential hypertension Her BP is doing ok today.  We will continue to monitor.  CAD (coronary artery disease) She denies any symptoms of angina.  She is statin intolerant.  She remains on an ASA and we will continue to control her BP.  Mild intermittent asthma without complication She uses her albuterol  HFA as needed.  Irritable bowel syndrome She takes questran  daily for her IBS. She can use dicyclomine  as needed for any abdominal pain if it occurs.  Gastroesophageal reflux disease She is not on any PPI and denies any reflux or dysphagia.  Multinodular goiter This has been stable. We will check her TSH today.  Meningioma [facility]) We discussed doing another MRI to follow her meningioma.  She does not want to do this at this point.  Primary osteoarthritis involving multiple joints She uses extra strength tylenol  as needed.  Stage 3a chronic kidney disease (HCC) We will continue to control her BP.  She is to avoid NSAIDS.  She is on losartan  for her CKD.  Overactive bladder She still does not want to try a medication or go to urology.  WE will follow up on this on another visit.  Statin intolerance She is intolerant to statins.  Spinal stenosis of lumbar region without neurogenic claudication She still has numbness and tingling but no weakness.  She does not want referral back to neurosurgery as of yet.  Insomnia We will continue to monitor.  Hypercholesterolemia She is statin intolerant.  She remains on cholestyramine .  We will check her FLP today.  Cervical spinal stenosis Plan as above.  BMI 25.0-25.9,adult Continue to eat healthy, contnue to be active with exercise.   Prevention Health maintenance discussed.  She ages out for colonosocpy and she does not want a repeat DEXA.  We will arrange for a mammogram.  We will obtain some yearly labs.  Medicare Attestation I have personally reviewed: The patient's medical and social history Their use of alcohol ,  tobacco or illicit drugs Their current medications and supplements The patient's functional ability including ADLs,fall risks, home safety risks, cognitive, and hearing and visual impairment Diet and physical activities Evidence for depression or mood disorders  The patient's weight, height, and BMI have been recorded in the chart.  I have made referrals, counseling, and provided education to the patient based on review of the above and I have provided the patient with a written personalized care plan for preventive services.     Selinda Fleeta Finger, MD   03/17/2024

## 2024-03-17 NOTE — Assessment & Plan Note (Signed)
 She is statin intolerant.  She remains on cholestyramine .  We will check her FLP today.

## 2024-03-17 NOTE — Assessment & Plan Note (Signed)
 She still does not want to try a medication or go to urology.  WE will follow up on this on another visit.

## 2024-03-17 NOTE — Assessment & Plan Note (Signed)
 She denies any symptoms of angina.  She is statin intolerant.  She remains on an ASA and we will continue to control her BP.

## 2024-03-17 NOTE — Assessment & Plan Note (Signed)
 She takes questran  daily for her IBS. She can use dicyclomine  as needed for any abdominal pain if it occurs.

## 2024-03-17 NOTE — Assessment & Plan Note (Signed)
 Plan as above.

## 2024-03-17 NOTE — Assessment & Plan Note (Signed)
 This has been stable. We will check her TSH today.

## 2024-03-17 NOTE — Assessment & Plan Note (Signed)
 We discussed doing another MRI to follow her meningioma.  She does not want to do this at this point.

## 2024-03-17 NOTE — Assessment & Plan Note (Signed)
 We will continue to control her BP.  She is to avoid NSAIDS.  She is on losartan  for her CKD.

## 2024-03-17 NOTE — Assessment & Plan Note (Signed)
 She uses her albuterol  HFA as needed.

## 2024-03-17 NOTE — Assessment & Plan Note (Signed)
She is intolerant to statins.

## 2024-03-17 NOTE — Assessment & Plan Note (Signed)
 Her BP is doing ok today.  We will continue to monitor.

## 2024-03-17 NOTE — Assessment & Plan Note (Signed)
 She still has numbness and tingling but no weakness.  She does not want referral back to neurosurgery as of yet.

## 2024-03-18 LAB — CMP14 + ANION GAP
ALT: 17 IU/L (ref 0–32)
AST: 22 IU/L (ref 0–40)
Albumin: 4.5 g/dL (ref 3.7–4.7)
Alkaline Phosphatase: 57 IU/L (ref 44–121)
Anion Gap: 15 mmol/L (ref 10.0–18.0)
BUN/Creatinine Ratio: 22 (ref 12–28)
BUN: 20 mg/dL (ref 8–27)
Bilirubin Total: 0.4 mg/dL (ref 0.0–1.2)
CO2: 20 mmol/L (ref 20–29)
Calcium: 9.7 mg/dL (ref 8.7–10.3)
Chloride: 105 mmol/L (ref 96–106)
Creatinine, Ser: 0.91 mg/dL (ref 0.57–1.00)
Globulin, Total: 2.3 g/dL (ref 1.5–4.5)
Glucose: 87 mg/dL (ref 70–99)
Potassium: 5.4 mmol/L — ABNORMAL HIGH (ref 3.5–5.2)
Sodium: 140 mmol/L (ref 134–144)
Total Protein: 6.8 g/dL (ref 6.0–8.5)
eGFR: 63 mL/min/1.73 (ref >59)

## 2024-03-18 LAB — CBC WITH DIFFERENTIAL/PLATELET
Basophils Absolute: 0.1 x10E3/uL (ref 0.0–0.2)
Basos: 1 %
EOS (ABSOLUTE): 0.1 x10E3/uL (ref 0.0–0.4)
Eos: 2 %
Hematocrit: 36.8 % (ref 34.0–46.6)
Hemoglobin: 12 g/dL (ref 11.1–15.9)
Immature Grans (Abs): 0 x10E3/uL (ref 0.0–0.1)
Immature Granulocytes: 0 %
Lymphocytes Absolute: 2.2 x10E3/uL (ref 0.7–3.1)
Lymphs: 38 %
MCH: 33.1 pg — ABNORMAL HIGH (ref 26.6–33.0)
MCHC: 32.6 g/dL (ref 31.5–35.7)
MCV: 101 fL — ABNORMAL HIGH (ref 79–97)
Monocytes Absolute: 0.3 x10E3/uL (ref 0.1–0.9)
Monocytes: 5 %
Neutrophils Absolute: 3.1 x10E3/uL (ref 1.4–7.0)
Neutrophils: 54 %
Platelets: 213 x10E3/uL (ref 150–450)
RBC: 3.63 x10E6/uL — ABNORMAL LOW (ref 3.77–5.28)
RDW: 12.4 % (ref 11.7–15.4)
WBC: 5.8 x10E3/uL (ref 3.4–10.8)

## 2024-03-18 LAB — LIPID PANEL
Chol/HDL Ratio: 3.2 ratio (ref 0.0–4.4)
Cholesterol, Total: 211 mg/dL — ABNORMAL HIGH (ref 100–199)
HDL: 65 mg/dL (ref >39)
LDL Chol Calc (NIH): 105 mg/dL — ABNORMAL HIGH (ref 0–99)
Triglycerides: 240 mg/dL — ABNORMAL HIGH (ref 0–149)
VLDL Cholesterol Cal: 41 mg/dL — ABNORMAL HIGH (ref 5–40)

## 2024-03-18 LAB — HEMOGLOBIN A1C
Est. average glucose Bld gHb Est-mCnc: 117 mg/dL
Hgb A1c MFr Bld: 5.7 % — ABNORMAL HIGH (ref 4.8–5.6)

## 2024-03-18 LAB — TSH: TSH: 0.775 u[IU]/mL (ref 0.450–4.500)

## 2024-03-29 ENCOUNTER — Other Ambulatory Visit: Payer: Self-pay | Admitting: Internal Medicine

## 2024-04-05 ENCOUNTER — Ambulatory Visit: Payer: Self-pay

## 2024-04-05 NOTE — Progress Notes (Signed)
 Patient called.  Unable to reach patient.  I have called and was unable to leave a voicemail. The patient needs to be informed that Dr. Fleeta Finger stated Her labs look good but her cholesterol does not.  With her history of heart disease, she needs to restart the zetia  10mg  at bedtime.  Hopefully this will control her cholesterol. [name]

## 2024-04-06 DIAGNOSIS — L821 Other seborrheic keratosis: Secondary | ICD-10-CM | POA: Diagnosis not present

## 2024-04-07 NOTE — Progress Notes (Signed)
 Patient called.  Left message for patient to call back.  I have called and left the patient a voicemail to return our phone call. The patient needs to be informed that Dr. Fleeta Finger stated Her labs look good but her cholesterol does not.  With her history of heart disease, she needs to restart the zetia  10mg  at bedtime.  Hopefully this will control her cholesterol. [name]

## 2024-04-12 ENCOUNTER — Ambulatory Visit: Admitting: Internal Medicine

## 2024-04-12 DIAGNOSIS — Z23 Encounter for immunization: Secondary | ICD-10-CM | POA: Diagnosis not present

## 2024-04-12 NOTE — Progress Notes (Signed)
Nurse visit  Flu shot

## 2024-05-06 ENCOUNTER — Ambulatory Visit: Admitting: Podiatry

## 2024-05-06 DIAGNOSIS — M71372 Other bursal cyst, left ankle and foot: Secondary | ICD-10-CM

## 2024-05-06 DIAGNOSIS — L02611 Cutaneous abscess of right foot: Secondary | ICD-10-CM

## 2024-05-06 MED ORDER — CEPHALEXIN 500 MG PO CAPS: 500.0000 mg | ORAL_CAPSULE | Freq: Three times a day (TID) | ORAL | 0 refills | Status: AC

## 2024-05-06 NOTE — Progress Notes (Signed)
 Chief Complaint  Patient presents with   Callouses    Right fifth corn, sore. She would also like injection for the left foot, L5 is a little red. Goes barefoot most of the time.  Not diabetic, ASA    HPI: 83 y.o. female presents today with the above concerns.  She is requesting a cortisone injection in the left fifth toe.  She notes a fairly painful corn on the right fifth toe.  Her husband is with her today.  Past Medical History:  Diagnosis Date   Arthritis    Asthma    Cervical spinal stenosis    CKD (chronic kidney disease), stage III (HCC)    Complication of anesthesia     difficulty breathing with back surgery 2013   GERD (gastroesophageal reflux disease)    Hypercholesterolemia    Hypertension    DR [name]  [place] CARD IN [place]   IBS (irritable bowel syndrome)    Insomnia    Lumbar stenosis    Meningioma (HCC)    Multinodular goiter    Overactive bladder    Statin intolerance    Vitamin D  deficiency    Past Surgical History:  Procedure Laterality Date   ABDOMINAL HYSTERECTOMY     1996   BACK SURGERY     2010   CARDIAC CATHETERIZATION     1998  HIGH PT   CERVICAL DISC SURGERY     12/2008   CHOLECYSTECTOMY     LEFT HEART CATH AND CORONARY ANGIOGRAPHY N/A 06/28/2019   Procedure: LEFT HEART CATH AND CORONARY ANGIOGRAPHY;  Surgeon: Verlin Lonni BIRCH, MD;  Location: [facility];  Service: Cardiovascular;  Laterality: N/A;   LUMBAR FUSION     Allergies  Allergen Reactions   Crestor [Rosuvastatin]    Fenofibrate    Gabapentin    Lipitor [Atorvastatin]    Lisinopril    Pravastatin    Simvastatin      Physical Exam: Palpable pedal pulses.  There is slight erythema to the dorsal lateral aspect of the left fifth toe at the PIP joint with pain on palpation of the area.  No open lesions are noted.  No interdigital corns are noted.  The right fifth toe has an abscessed corn present on the dorsal lateral aspect of the right fifth PIPJ.  There  is pain on palpation of the area.  There is localized edema and erythema and calor.  Epicritic sensation intact  Assessment/Plan of Care: 1. Other bursal cyst, left ankle and foot   2. Abscess of toe of right foot      Meds ordered this encounter  Medications   cephALEXin (KEFLEX) 500 MG capsule    Sig: Take 1 capsule (500 mg total) by mouth 3 (three) times daily for 7 days.    Dispense:  21 capsule    Refill:  0  With the patient's verbal consent and following a sterile skin prep, a corticosteroid injection was adminstered to the left fifth toe PIPJ.  This consisted of a mixture of 1% lidocaine  plain, 0.5% sensorcaine plain, and Kenalog-10 for a total of 1 cc administered.  Bandaid applied. Patient tolerated this well.     Anesthetic spray was applied to the right fifth toe and a sterile #313 blade was utilized to create an incision in the abscess located at the dorsal lateral aspect of the PIPJ and golden purulence was drained from the area.  A sterile skin scrub was performed for a few minutes and then Iodosorb  ointment followed by a dry sterile dressing was applied.  Prescribed cephalexin 500 mg 1 capsule 3 times daily x 7 days for the abscess/infection.  Follow-up in 1 week if there is no continued improvement.  Discussed importance of wider shoe gear to accommodate the fifth toes better.  Awanda CHARM Imperial, DPM, FACFAS [facility]     [address] [place], [place] [address]                Office [phone]  Fax [phone]

## 2024-05-13 ENCOUNTER — Other Ambulatory Visit: Payer: Self-pay

## 2024-05-13 MED ORDER — EZETIMIBE 10 MG PO TABS: 10.0000 mg | ORAL_TABLET | Freq: Every day | ORAL | 0 refills | Status: DC

## 2024-05-13 NOTE — Progress Notes (Signed)
 New rx Zetia  10mg  at bedtime per Dr. Fleeta Finger sent to [facility]

## 2024-05-13 NOTE — Progress Notes (Signed)
 Patient called.  Patient aware.

## 2024-05-21 DIAGNOSIS — Z1231 Encounter for screening mammogram for malignant neoplasm of breast: Secondary | ICD-10-CM | POA: Diagnosis not present

## 2024-05-24 ENCOUNTER — Encounter: Payer: Self-pay | Admitting: Internal Medicine

## 2024-05-25 DIAGNOSIS — L578 Other skin changes due to chronic exposure to nonionizing radiation: Secondary | ICD-10-CM | POA: Diagnosis not present

## 2024-05-25 DIAGNOSIS — L570 Actinic keratosis: Secondary | ICD-10-CM | POA: Diagnosis not present

## 2024-05-25 DIAGNOSIS — L821 Other seborrheic keratosis: Secondary | ICD-10-CM | POA: Diagnosis not present

## 2024-05-26 ENCOUNTER — Ambulatory Visit: Admitting: Podiatry

## 2024-05-26 DIAGNOSIS — L84 Corns and callosities: Secondary | ICD-10-CM | POA: Diagnosis not present

## 2024-05-26 DIAGNOSIS — M21622 Bunionette of left foot: Secondary | ICD-10-CM

## 2024-05-26 DIAGNOSIS — M2041 Other hammer toe(s) (acquired), right foot: Secondary | ICD-10-CM | POA: Diagnosis not present

## 2024-05-26 DIAGNOSIS — M2042 Other hammer toe(s) (acquired), left foot: Secondary | ICD-10-CM | POA: Diagnosis not present

## 2024-05-26 NOTE — Progress Notes (Unsigned)
 Chief Complaint  Patient presents with   Toe Pain    Left 5th toe-was painful and throbbing at last visit. Has since improved as far as pain level goes. Would like to know if you are still suggesting having the scraping procedure performed.   Right 5th-has corn and callus, would like to know if you would shave that down some today.    HPI: 83 y.o. female presents today requesting her right fifth toe corn be shaved today and also wants to discuss surgery for the left fifth toe and possibly the left tailor's bunion.  She states that these continue to give her pain and discomfort in shoes.  Motions are uncomfortable.  She is interested in having this done if she can have it before the end of the year.  Past Medical History:  Diagnosis Date   Arthritis    Asthma    Cervical spinal stenosis    CKD (chronic kidney disease), stage III (HCC)    Complication of anesthesia     difficulty breathing with back surgery 2013   GERD (gastroesophageal reflux disease)    Hypercholesterolemia    Hypertension    DR [name]  [place] [facility]ARD IN [place]   IBS (irritable bowel syndrome)    Insomnia    Lumbar stenosis    Meningioma (HCC)    Multinodular goiter    Overactive bladder    Statin intolerance    Vitamin D  deficiency    Past Surgical History:  Procedure Laterality Date   ABDOMINAL HYSTERECTOMY     1996   BACK SURGERY     2010   CARDIAC CATHETERIZATION     1998  HIGH PT   CERVICAL DISC SURGERY     12/2008   CHOLECYSTECTOMY     LEFT HEART CATH AND CORONARY ANGIOGRAPHY N/A 06/28/2019   Procedure: LEFT HEART CATH AND CORONARY ANGIOGRAPHY;  Surgeon: Verlin Lonni BIRCH, MD;  Location: [facility];  Service: Cardiovascular;  Laterality: N/A;   LUMBAR FUSION     Allergies  Allergen Reactions   Crestor [Rosuvastatin]    Fenofibrate    Gabapentin    Lipitor [Atorvastatin]    Lisinopril    Pravastatin    Simvastatin     Physical Exam: Palpable pedal pulses noted.   There is contracture with medial/varus angulation of the left fifth toe.  There is a bony palpable prominence on the left fifth metatarsal head with pain on palpation.  There is erythema to the PIPJ of the left fifth toe and the lateral aspect of the fifth metatarsal head from irritation.  No signs of infection are noted.  No open lesions are noted.  There is a small corn on the dorsal lateral aspect of the right fifth toe PIPJ.  Epicritic sensation intact  Assessment/Plan of Care: 1. Hammertoe of left foot   2. Tailor's bunionette, left   3. Corn of toe   4. Hammertoe of right foot     Discussed findings with patient today.  We did discuss possible surgical options for the left fifth toe and tailor's bunion.  She would like to proceed with getting it scheduled.  I offered to have her reschedule for a formal surgical consultation or we can obtain x-rays and review.  She would like to go ahead and try to get the surgery scheduled now and then we will reschedule to have the x-rays performed and be shown more detail the next appointment.  Benefits, risk, and possible postoperative complications were discussed  with the patient.  Informed her this will be performed as an outpatient procedure at [facility] under intravenous sedation with local anesthesia.  Also discussed possible sequela if she opted not to proceed with any surgical intervention.  Verbal and written consent were obtained preoperatively.  Her husband was present for the visit.  All questions were answered but will bring her back for x-ray evaluation and further discussion in the near future.  The corn on the right fifth toe PIPJ was shaved uneventfully with a sterile #313 blade as a courtesy.  Awanda CHARM Imperial, DPM, FACFAS [facility]    [address] [place], [place] [address]                Office [phone]  Fax [phone]

## 2024-06-07 ENCOUNTER — Telehealth: Payer: Self-pay | Admitting: Podiatry

## 2024-06-07 NOTE — Telephone Encounter (Signed)
 Patient called and left message to cancel surgery. Called patient back and confirmed cancellation

## 2024-06-14 ENCOUNTER — Ambulatory Visit: Admitting: Internal Medicine

## 2024-06-14 ENCOUNTER — Encounter: Payer: Self-pay | Admitting: Internal Medicine

## 2024-06-14 VITALS — BP 130/90 | HR 66 | Temp 98.0°F | Resp 18 | Ht 62.0 in | Wt 145.5 lb

## 2024-06-14 DIAGNOSIS — I10 Essential (primary) hypertension: Secondary | ICD-10-CM

## 2024-06-14 DIAGNOSIS — E7800 Pure hypercholesterolemia, unspecified: Secondary | ICD-10-CM | POA: Diagnosis not present

## 2024-06-14 DIAGNOSIS — I2510 Atherosclerotic heart disease of native coronary artery without angina pectoris: Secondary | ICD-10-CM

## 2024-06-14 MED ORDER — CHOLESTYRAMINE 4 G PO PACK: 4.0000 g | PACK | Freq: Every day | ORAL | 3 refills | Status: AC

## 2024-06-14 MED ORDER — METOPROLOL TARTRATE 100 MG PO TABS: 100.0000 mg | ORAL_TABLET | Freq: Every day | ORAL | 3 refills | Status: AC

## 2024-06-14 MED ORDER — LOSARTAN POTASSIUM 50 MG PO TABS: 75.0000 mg | ORAL_TABLET | Freq: Every day | ORAL | 3 refills | Status: AC

## 2024-06-14 MED ORDER — EZETIMIBE 10 MG PO TABS: 10.0000 mg | ORAL_TABLET | Freq: Every day | ORAL | 3 refills | Status: AC

## 2024-06-14 NOTE — Assessment & Plan Note (Signed)
 She denies any chest pain with exertion.  She is statin intolerant but I have her on zetia  now and she is on an ASA 81mg  daily.

## 2024-06-14 NOTE — Assessment & Plan Note (Signed)
 Her BP is doing well and we will continue on her current medications.

## 2024-06-14 NOTE — Assessment & Plan Note (Signed)
 We will check her FLP since she is on zetia .

## 2024-06-14 NOTE — Progress Notes (Signed)
 Office Visit  Subjective   Patient ID: [name]   DOB: 21-Jun-1941   Age: 83 y.o.   MRN: [id number]   Chief Complaint Chief Complaint  Patient presents with   Follow-up    3 month     History of Present Illness The patient is a 83 year old Caucasian/White female who presents for a follow-up of her hypertension.  Since her last visit, she has not had any problems.  This past year, she has had some elevated BP's .   The patient has not been checking her blood pressure at home.  She is currently on losartan  75mg  daily and metoprolol  tartrate 100mg  daily.  The patient denies any visual changes, dizziness, lightheadness, chest pain, shortness of breath, and edema. She reports there have been no other symptoms noted.   The patient also has a history of Stage IIIa CKD where we have noted there has been no change to her kidney function over the last several years from about 2019.  Her baseline creatinine ranges 0.9-1.1 with a GFR of 47-62.  She denies any NSAID use.     Levorn BECKER [name] returns today for routine followup on her cholesterol.  We noted in 2019 that her cholesterol was elevated and her ASCVD score was 20%.  We tried her on red yeast rice but this upset her stomach.  She does not want to be on a statin.  She has been on zocor, pravastatin, atorvastatin and crestor which has caused her to have mental status changes.  On her last visit, her cholesterol was elevated and I asked her to start zetia  due to her history of CAD.[name]  Overall, she states she is doing well and is without any complaints or problems at this time. She specifically denies abdominal pain, nausea, vomiting, diarrhea, myalgias, and fatigue. She remains on dietary management as well as a regular exercise program and krill oil and cholestyramine .  We discussed nexlitol.  She is now on zetia  10mg  daily and denies any side effects.  She is fasting in anticipation for labs today.    The patient has a history of mild CAD where in  2020 she was having symptoms of chest tightness where she was sent to cardiology.  She states that if she exerted herself or strained during a bowel movement, she would get chest tightness with pain that would  radiate into her left neck and down her left arm.  She did have an ECHO stress test done in 03/2011 and this showed a negative treadmill EKG and normal LVEF 55-60% with no ischemia.  She had some chest pain in 2018 and underwent a heart catherization on 08/2016.  This showed non-obstructive CAD and she had a normal LVEF of 60%.  They felt she was clear but gave her NTG to use prn.  I referred her back to cardiology where they saw her in 05/2019 and felt she was having possible angina pectoris.  She underwent a repeat heart cath in 06/2019 which showed non-obstructive CAD with 10% stenosis of the LAD. Her LVEF was 65% without wall motion abnormality.  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  Her last visit with cardiology was in 07/2019 and they felt she was doing well.       Past Medical History Past Medical History:  Diagnosis Date   Arthritis    Asthma    Cervical spinal stenosis    CKD (chronic kidney disease), stage III (HCC)  Complication of anesthesia     difficulty breathing with back surgery 2013   GERD (gastroesophageal reflux disease)    Hypercholesterolemia    Hypertension    DR [name]  [place] CARD IN [place]   IBS (irritable bowel syndrome)    Insomnia    Lumbar stenosis    Meningioma (HCC)    Multinodular goiter    Overactive bladder    Statin intolerance    Vitamin D  deficiency      Allergies Allergies  Allergen Reactions   Crestor [Rosuvastatin]    Fenofibrate    Gabapentin    Lipitor [Atorvastatin]    Lisinopril    Pravastatin    Simvastatin      Medications  Current Outpatient Medications:    acetaminophen  (TYLENOL ) 650 MG CR tablet, Take 1,300 mg by mouth 2 (two) times daily., Disp: , Rfl:    albuterol  (VENTOLIN  HFA) 108  (90 Base) MCG/ACT inhaler, Inhale 2 puffs into the lungs every 6 (six) hours as needed for wheezing or shortness of breath., Disp: 8 g, Rfl: 2   aspirin  EC 81 MG tablet, Take 81 mg by mouth daily. , Disp: , Rfl:    cholestyramine  (QUESTRAN ) 4 g packet, dissolve 1 scoop in 2-6 ounces of uncarbonated liquid and drink daily., Disp: 60 packet, Rfl: 3   cyclobenzaprine  (FLEXERIL ) 10 MG tablet, Take 1 tablet (10 mg total) by mouth 2 (two) times daily as needed. For muscle spasms, Disp: 30 tablet, Rfl: 2   ezetimibe  (ZETIA ) 10 MG tablet, Take 1 tablet (10 mg total) by mouth at bedtime., Disp: 90 tablet, Rfl: 0   losartan  (COZAAR ) 50 MG tablet, Take 1.5 tablets (75 mg total) by mouth daily., Disp: 135 tablet, Rfl: 3   metoprolol  tartrate (LOPRESSOR ) 100 MG tablet, Take 1 tablet (100 mg total) by mouth daily., Disp: 90 tablet, Rfl: 3   nitroGLYCERIN  (NITROSTAT ) 0.4 MG SL tablet, Place 1 tablet (0.4 mg total) under the tongue every 5 (five) minutes as needed., Disp: 30 tablet, Rfl: 0   Review of Systems Review of Systems  Constitutional:  Negative for chills, fever, malaise/fatigue and weight loss.  Eyes:  Negative for blurred vision and double vision.  Respiratory:  Negative for cough and shortness of breath.   Cardiovascular:  Negative for chest pain, palpitations and leg swelling.  Gastrointestinal:  Negative for abdominal pain, constipation, diarrhea, heartburn, nausea and vomiting.  Genitourinary:  Negative for frequency.  Musculoskeletal:  Negative for myalgias.  Skin:  Negative for itching and rash.  Neurological:  Negative for dizziness, weakness and headaches.  Endo/Heme/Allergies:  Negative for polydipsia.       Objective:    Vitals BP (!) 130/90 (BP Location: Left Arm, Patient Position: Sitting, Cuff Size: Normal)   Pulse 66   Temp 98 F (36.7 C)   Resp 18   Ht 5' 2 (1.575 m)   Wt 145 lb 8 oz (66 kg)   BMI 26.61 kg/m    Physical Examination Physical Exam Constitutional:       Appearance: Normal appearance. She is not ill-appearing.  Cardiovascular:     Rate and Rhythm: Normal rate and regular rhythm.     Pulses: Normal pulses.     Heart sounds: No murmur heard.    No friction rub. No gallop.  Pulmonary:     Effort: Pulmonary effort is normal. No respiratory distress.     Breath sounds: No wheezing, rhonchi or rales.  Abdominal:     General: Bowel sounds are  normal. There is no distension.     Palpations: Abdomen is soft.     Tenderness: There is no abdominal tenderness.  Musculoskeletal:     Right lower leg: No edema.     Left lower leg: No edema.  Skin:    General: Skin is warm and dry.     Findings: No rash.  Neurological:     General: No focal deficit present.     Mental Status: She is alert and oriented to person, place, and time.  Psychiatric:        Mood and Affect: Mood normal.        Behavior: Behavior normal.        Assessment & Plan:   Essential hypertension Her BP is doing well and we will continue on her current medications.    CAD (coronary artery disease) She denies any chest pain with exertion.  She is statin intolerant but I have her on zetia  now and she is on an ASA 81mg  daily.  Hypercholesterolemia We will check her FLP since she is on zetia .    Return in about 3 months (around 09/12/2024).   Selinda Fleeta Finger, MD

## 2024-06-15 LAB — LIPID PANEL
Chol/HDL Ratio: 2.9 ratio (ref 0.0–4.4)
Cholesterol, Total: 220 mg/dL — ABNORMAL HIGH (ref 100–199)
HDL: 77 mg/dL (ref >39)
LDL Chol Calc (NIH): 99 mg/dL (ref 0–99)
Triglycerides: 262 mg/dL — ABNORMAL HIGH (ref 0–149)
VLDL Cholesterol Cal: 44 mg/dL — ABNORMAL HIGH (ref 5–40)

## 2024-06-16 ENCOUNTER — Ambulatory Visit: Admitting: Internal Medicine

## 2024-06-17 ENCOUNTER — Ambulatory Visit: Admitting: Podiatry

## 2024-06-18 ENCOUNTER — Ambulatory Visit: Admitting: Podiatry

## 2024-06-29 ENCOUNTER — Ambulatory Visit: Payer: Self-pay

## 2024-06-29 NOTE — Progress Notes (Signed)
 Patient called.  Patient aware.  Per Dr. Fleeta Finger Her cholesterol is better controlled but not at goal.  We can add nexletol (we discussed this before) which is not a statin.  Ask her if she would like to start this. .  Pt states she does not want to take any medications and will wait to talk to Dr. Caleen at her next appointment.

## 2024-07-01 ENCOUNTER — Encounter: Admitting: Podiatry

## 2024-07-16 ENCOUNTER — Encounter: Admitting: Podiatry

## 2024-07-29 ENCOUNTER — Encounter: Admitting: Podiatry

## 2024-08-06 ENCOUNTER — Encounter: Payer: Self-pay | Admitting: *Deleted

## 2024-08-06 NOTE — Progress Notes (Signed)
 [name]                                          MRN: [id number]   08/06/2024   The [facility] Quality Team Specialist reviewed this patient medical record for the purposes of chart review for care gap closure. The following were reviewed: chart review for care gap closure-controlling blood pressure.    [facility] Quality Team

## 2024-09-10 ENCOUNTER — Ambulatory Visit: Admitting: Internal Medicine
